# Patient Record
Sex: Female | Born: 2000 | Race: White | Hispanic: No | Marital: Single | Smoking: Never smoker
Health system: Southern US, Community
[De-identification: ages and names within clinical notes are randomized; demographics above are authoritative.]

## PROBLEM LIST (undated history)

## (undated) DIAGNOSIS — J45909 Unspecified asthma, uncomplicated: Secondary | ICD-10-CM

## (undated) DIAGNOSIS — F419 Anxiety disorder, unspecified: Secondary | ICD-10-CM

## (undated) HISTORY — DX: Unspecified asthma, uncomplicated: J45.909

## (undated) HISTORY — PX: DENTAL SURGERY: SHX609

## (undated) HISTORY — DX: Anxiety disorder, unspecified: F41.9

---
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
## 2020-06-29 ENCOUNTER — Emergency Department: Payer: Federal, State, Local not specified - PPO

## 2020-06-29 ENCOUNTER — Emergency Department
Admission: EM | Admit: 2020-06-29 | Discharge: 2020-06-29 | Disposition: A | Discharge status: Home / Self Care | Payer: Federal, State, Local not specified - PPO | Attending: Emergency Medicine | Admitting: Emergency Medicine

## 2020-06-29 ENCOUNTER — Ambulatory Visit
Admission: EM | Admit: 2020-06-29 | Discharge: 2020-06-29 | Disposition: A | Discharge status: Home / Self Care | Payer: Federal, State, Local not specified - PPO | Attending: Family Medicine | Admitting: Family Medicine

## 2020-06-29 ENCOUNTER — Other Ambulatory Visit: Payer: Self-pay

## 2020-06-29 DIAGNOSIS — N946 Dysmenorrhea, unspecified: Secondary | ICD-10-CM

## 2020-06-29 DIAGNOSIS — R1030 Lower abdominal pain, unspecified: Secondary | ICD-10-CM

## 2020-06-29 DIAGNOSIS — R1031 Right lower quadrant pain: Secondary | ICD-10-CM

## 2020-06-29 LAB — URINALYSIS, COMPLETE (UACMP) WITH MICROSCOPIC
Bacteria, UA: NONE SEEN
Bilirubin Urine: NEGATIVE
Glucose, UA: NEGATIVE mg/dL
Ketones, ur: NEGATIVE mg/dL
Nitrite: NEGATIVE
Protein, ur: NEGATIVE mg/dL
Specific Gravity, Urine: 1.023 (ref 1.005–1.030)
pH: 5 (ref 5.0–8.0)

## 2020-06-29 LAB — POCT URINALYSIS DIP (MANUAL ENTRY)
Bilirubin, UA: NEGATIVE
Glucose, UA: NEGATIVE mg/dL
Ketones, POC UA: NEGATIVE mg/dL
Nitrite, UA: NEGATIVE
Protein Ur, POC: NEGATIVE mg/dL
Spec Grav, UA: 1.025 (ref 1.010–1.025)
Urobilinogen, UA: 0.2 E.U./dL
pH, UA: 6 (ref 5.0–8.0)

## 2020-06-29 LAB — COMPREHENSIVE METABOLIC PANEL
ALT: 23 U/L (ref 0–44)
AST: 16 U/L (ref 15–41)
Albumin: 4.1 g/dL (ref 3.5–5.0)
Alkaline Phosphatase: 48 U/L (ref 38–126)
Anion gap: 10 (ref 5–15)
BUN: 13 mg/dL (ref 6–20)
CO2: 25 mmol/L (ref 22–32)
Calcium: 9.4 mg/dL (ref 8.9–10.3)
Chloride: 106 mmol/L (ref 98–111)
Creatinine, Ser: 0.9 mg/dL (ref 0.44–1.00)
GFR, Estimated: >60 mL/min (ref >60)
Glucose, Bld: 90 mg/dL (ref 70–99)
Potassium: 4.5 mmol/L (ref 3.5–5.1)
Sodium: 141 mmol/L (ref 135–145)
Total Bilirubin: 0.7 mg/dL (ref 0.3–1.2)
Total Protein: 7.5 g/dL (ref 6.5–8.1)

## 2020-06-29 LAB — CBC
HCT: 44.9 % (ref 36.0–46.0)
Hemoglobin: 14.9 g/dL (ref 12.0–15.0)
MCH: 28.7 pg (ref 26.0–34.0)
MCHC: 33.2 g/dL (ref 30.0–36.0)
MCV: 86.3 fL (ref 80.0–100.0)
Platelets: 318 10*3/uL (ref 150–400)
RBC: 5.2 MIL/uL — ABNORMAL HIGH (ref 3.87–5.11)
RDW: 11.9 % (ref 11.5–15.5)
WBC: 6.7 10*3/uL (ref 4.0–10.5)
nRBC: 0 % (ref 0.0–0.2)

## 2020-06-29 LAB — LIPASE, BLOOD: Lipase: 23 U/L (ref 11–51)

## 2020-06-29 LAB — POCT URINE PREGNANCY: Preg Test, Ur: NEGATIVE

## 2020-06-29 LAB — POC URINE PREG, ED: Preg Test, Ur: NEGATIVE

## 2020-06-29 MED ORDER — IOHEXOL 300 MG/ML  SOLN
100.0000 mL | Freq: Once | INTRAMUSCULAR | Status: AC | PRN
  Administered 2020-06-29: 100 mL via INTRAVENOUS
  Filled 2020-06-29: qty 100

## 2020-06-29 NOTE — ED Triage Notes (Signed)
Pt comes with c/o RLQ pain for last few days. Pt states some nausea and little diarrhea. Pt states she went to UC and was sent here for scan.

## 2020-06-29 NOTE — ED Triage Notes (Signed)
Pt presents with complaints of lower abdominal pain x 2 days. Pain is worse on the right side. Area is tender to touch and shoots across lower abdomen with palpation. Pt denies any other symptoms. Endorses feeling light headed when she turns her head for "awhile now".  Pt also has a rash on the back of her left arm from where a band aid was present.

## 2020-06-29 NOTE — Discharge Instructions (Addendum)
There is some concern for appendicitis I would recommend CT scan Please go to the ER to rule this out.

## 2020-06-29 NOTE — ED Provider Notes (Signed)
[facility] Emergency Department Provider Note   ____________________________________________    I have reviewed the triage vital signs and the nursing notes.   HISTORY  Chief Complaint RLQ pain     HPI Haley Williams is a 20 y.o. female who presents with complaints of right lower quadrant abdominal pain x3 days.  Patient reports the pain is mild and moderate.  Is made worse by walking or pressing on the area.  Went to urgent care prior to coming to the emergency department, concern for appendicitis so sent for imaging.  Patient has a history of PCOS, she is currently menstruating but her symptoms are worse than typical menstrual cramps.  No fever chills nausea vomiting.  Does not take anything for this  History reviewed. No pertinent past medical history.  There are no problems to display for this patient.   History reviewed. No pertinent surgical history.  Prior to Admission medications   Not on File     Allergies Patient has no known allergies.  Family History  Problem Relation Age of Onset  . Healthy Mother   . Healthy Father     Social History Social History   Tobacco Use  . Smoking status: Never Smoker  . Smokeless tobacco: Never Used    Review of Systems  Constitutional: No fever/chills Eyes: No visual changes.  ENT: No sore throat. Cardiovascular: Denies chest pain. Respiratory: Denies shortness of breath. Gastrointestinal: As above Genitourinary: Negative for dysuria. Musculoskeletal: Negative for back pain. Skin: Negative for rash. Neurological: Negative for headaches or weakness   ____________________________________________   PHYSICAL EXAM:  VITAL SIGNS: ED Triage Vitals [06/29/20 1131]  Enc Vitals Group     BP 121/72     Pulse Rate 78     Resp 18     Temp 98.3 F (36.8 C)     Temp src      SpO2 100 %     Weight 73.9 kg (163 lb)     Height 1.651 m (5\' 5" )     Head Circumference      Peak Flow       Pain Score 4     Pain Loc      Pain Edu?      Excl. in GC?     Constitutional: Alert and oriented. Eyes: Conjunctivae are normal.  Head: Atraumatic.  Cardiovascular: Normal rate, regular rhythm. Good peripheral circulation. Respiratory: Normal respiratory effort.  No retractions.  Gastrointestinal: Soft, mild tenderness right lower quadrant no distention.  No CVA tenderness. Genitourinary: deferred Musculoskeletal: No lower extremity tenderness nor edema.  Warm and well perfused Neurologic:  Normal speech and language. No gross focal neurologic deficits are appreciated.  Skin:  Skin is warm, dry and intact. No rash noted. Psychiatric: Mood and affect are normal. Speech and behavior are normal.  ____________________________________________   LABS (all labs ordered are listed, but only abnormal results are displayed)  Labs Reviewed  CBC - Abnormal; Notable for the following components:      Result Value   RBC 5.20 (*)    All other components within normal limits  URINALYSIS, COMPLETE (UACMP) WITH MICROSCOPIC - Abnormal; Notable for the following components:   Color, Urine YELLOW (*)    APPearance HAZY (*)    Hgb urine dipstick MODERATE (*)    Leukocytes,Ua MODERATE (*)    All other components within normal limits  LIPASE, BLOOD  COMPREHENSIVE METABOLIC PANEL  POC URINE PREG, ED   ____________________________________________  EKG  None ____________________________________________  RADIOLOGY  CT abdomen pelvis reviewed by me ____________________________________________   PROCEDURES  Procedure(s) performed: No  Procedures   Critical Care performed: No ____________________________________________   INITIAL IMPRESSION / ASSESSMENT AND PLAN / ED COURSE  Pertinent labs & imaging results that were available during my care of the patient were reviewed by me and considered in my medical decision making (see chart for details).  Patient here for right lower  quadrant pain.  Differential includes menstrual cramps, ruptured ovarian cyst/ovarian cyst, appendicitis, ureterolithiasis, UTI  Lab work is overall quite reassuring, very minimal tenderness to palpation  White blood cell count is normal, sent for CT  CT abdomen pelvis is negative for appendicitis, appropriate for discharge with supportive care, outpatient follow-up    ____________________________________________   FINAL CLINICAL IMPRESSION(S) / ED DIAGNOSES  Final diagnoses:  Right lower quadrant abdominal pain        Note:  This document was prepared using Dragon voice recognition software and may include unintentional dictation errors.   Jene Every, MD 06/29/20 1324

## 2020-06-29 NOTE — ED Provider Notes (Signed)
Renaldo Fiddler    CSN: [id number] Arrival date & time: 06/29/20  0950      History   Chief Complaint Chief Complaint  Patient presents with  . Abdominal Pain    HPI Haley Williams is a 20 y.o. female.   Patient is a 20 year old female who presents today with right lower quadrant, lower abdominal discomfort.  This has been worsening over the past 4 days.  Pain is worse on the right but radiates to the left.  Some umbilical pain.  Tender when she walks when she sits and tender to touch.  Slightly lightheaded.  No fever, chills, nausea or vomiting.  No dysuria, hematuria or urinary frequency.  Currently on her menstrual cycle.  Does have history of PCOS.     History reviewed. No pertinent past medical history.  There are no problems to display for this patient.   History reviewed. No pertinent surgical history.  OB History   No obstetric history on file.      Home Medications    Prior to Admission medications   Not on File    Family History Family History  Problem Relation Age of Onset  . Healthy Mother   . Healthy Father     Social History Social History   Tobacco Use  . Smoking status: Never Smoker  . Smokeless tobacco: Never Used     Allergies   Patient has no known allergies.   Review of Systems Review of Systems   Physical Exam Triage Vital Signs ED Triage Vitals  Enc Vitals Group     BP 06/29/20 1006 109/72     Pulse Rate 06/29/20 1006 81     Resp 06/29/20 1006 18     Temp 06/29/20 1006 98.1 F (36.7 C)     Temp Source 06/29/20 1006 Oral     SpO2 06/29/20 1006 98 %     Weight --      Height --      Head Circumference --      Peak Flow --      Pain Score 06/29/20 1017 6     Pain Loc --      Pain Edu? --      Excl. in GC? --    No data found.  Updated Vital Signs BP 109/72 (BP Location: Left Arm)   Pulse 81   Temp 98.1 F (36.7 C) (Oral)   Resp 18   LMP 06/29/2020   SpO2 98%   Visual Acuity Right Eye  Distance:   Left Eye Distance:   Bilateral Distance:    Right Eye Near:   Left Eye Near:    Bilateral Near:     Physical Exam Vitals and nursing note reviewed.  Constitutional:      General: She is not in acute distress.    Appearance: Normal appearance. She is not ill-appearing, toxic-appearing or diaphoretic.  HENT:     Head: Normocephalic.     Nose: Nose normal.  Eyes:     Conjunctiva/sclera: Conjunctivae normal.  Pulmonary:     Effort: Pulmonary effort is normal.  Abdominal:     General: Abdomen is flat. Bowel sounds are normal.     Palpations: Abdomen is soft.     Tenderness: There is abdominal tenderness in the right lower quadrant, periumbilical area and left lower quadrant. There is no right CVA tenderness, left CVA tenderness, guarding or rebound.  Musculoskeletal:        General: Normal range of motion.  Cervical back: Normal range of motion.  Skin:    General: Skin is warm and dry.     Findings: No rash.  Neurological:     Mental Status: She is alert.  Psychiatric:        Mood and Affect: Mood normal.      UC Treatments / Results  Labs (all labs ordered are listed, but only abnormal results are displayed) Labs Reviewed  POCT URINALYSIS DIP (MANUAL ENTRY) - Abnormal; Notable for the following components:      Result Value   Blood, UA moderate (*)    Leukocytes, UA Trace (*)    All other components within normal limits  URINE CULTURE  POCT URINE PREGNANCY    EKG   Radiology No results found.  Procedures Procedures (including critical care time)  Medications Ordered in UC Medications - No data to display  Initial Impression / Assessment and Plan / UC Course  I have reviewed the triage vital signs and the nursing notes.  Pertinent labs & imaging results that were available during my care of the patient were reviewed by me and considered in my medical decision making (see chart for details).     Lower abdominal pain. Concerns for  possible appendicitis based on exam and symptoms. Urine with trace leuks and moderate blood here today.  Patient currently on her menstrual cycle.  Will send for culture. She is not having any urinary symptoms. Sending patient to the ER for rule out appendicitis Final Clinical Impressions(s) / UC Diagnoses   Final diagnoses:  Lower abdominal pain     Discharge Instructions     There is some concern for appendicitis I would recommend CT scan Please go to the ER to rule this out.      ED Prescriptions    None     PDMP not reviewed this encounter.   Janace Aris, NP 06/29/20 1057

## 2020-06-29 NOTE — Discharge Instructions (Addendum)
Your lab tests and CT scan today were all reassuring. Take ibuprofen and/or tylenol for pain.

## 2020-06-30 ENCOUNTER — Ambulatory Visit: Payer: Self-pay

## 2020-06-30 LAB — URINE CULTURE: Culture: 40000 — AB

## 2020-08-24 ENCOUNTER — Encounter: Payer: Self-pay | Admitting: Emergency Medicine

## 2020-08-24 ENCOUNTER — Emergency Department: Payer: Federal, State, Local not specified - PPO

## 2020-08-24 ENCOUNTER — Other Ambulatory Visit: Payer: Self-pay

## 2020-08-24 ENCOUNTER — Emergency Department
Admission: EM | Admit: 2020-08-24 | Discharge: 2020-08-24 | Disposition: A | Discharge status: Home / Self Care | Payer: Federal, State, Local not specified - PPO | Attending: Student in an Organized Health Care Education/Training Program | Admitting: Student in an Organized Health Care Education/Training Program

## 2020-08-24 DIAGNOSIS — R1031 Right lower quadrant pain: Secondary | ICD-10-CM | POA: Insufficient documentation

## 2020-08-24 DIAGNOSIS — R1030 Lower abdominal pain, unspecified: Secondary | ICD-10-CM

## 2020-08-24 LAB — COMPREHENSIVE METABOLIC PANEL
ALT: 31 U/L (ref 0–44)
AST: 24 U/L (ref 15–41)
Albumin: 4.6 g/dL (ref 3.5–5.0)
Alkaline Phosphatase: 50 U/L (ref 38–126)
Anion gap: 7 (ref 5–15)
BUN: 21 mg/dL — ABNORMAL HIGH (ref 6–20)
CO2: 27 mmol/L (ref 22–32)
Calcium: 9.7 mg/dL (ref 8.9–10.3)
Chloride: 103 mmol/L (ref 98–111)
Creatinine, Ser: 0.93 mg/dL (ref 0.44–1.00)
GFR, Estimated: >60 mL/min (ref >60)
Glucose, Bld: 103 mg/dL — ABNORMAL HIGH (ref 70–99)
Potassium: 4 mmol/L (ref 3.5–5.1)
Sodium: 137 mmol/L (ref 135–145)
Total Bilirubin: 0.8 mg/dL (ref 0.3–1.2)
Total Protein: 7.5 g/dL (ref 6.5–8.1)

## 2020-08-24 LAB — CBC WITH DIFFERENTIAL/PLATELET
Abs Immature Granulocytes: 0.03 10*3/uL (ref 0.00–0.07)
Basophils Absolute: 0 10*3/uL (ref 0.0–0.1)
Basophils Relative: 0 %
Eosinophils Absolute: 0.2 10*3/uL (ref 0.0–0.5)
Eosinophils Relative: 2 %
HCT: 43.5 % (ref 36.0–46.0)
Hemoglobin: 14.4 g/dL (ref 12.0–15.0)
Immature Granulocytes: 0 %
Lymphocytes Relative: 36 %
Lymphs Abs: 3.4 10*3/uL (ref 0.7–4.0)
MCH: 28.7 pg (ref 26.0–34.0)
MCHC: 33.1 g/dL (ref 30.0–36.0)
MCV: 86.7 fL (ref 80.0–100.0)
Monocytes Absolute: 0.5 10*3/uL (ref 0.1–1.0)
Monocytes Relative: 5 %
Neutro Abs: 5.2 10*3/uL (ref 1.7–7.7)
Neutrophils Relative %: 57 %
Platelets: 340 10*3/uL (ref 150–400)
RBC: 5.02 MIL/uL (ref 3.87–5.11)
RDW: 12 % (ref 11.5–15.5)
WBC: 9.3 10*3/uL (ref 4.0–10.5)
nRBC: 0 % (ref 0.0–0.2)

## 2020-08-24 LAB — URINALYSIS, COMPLETE (UACMP) WITH MICROSCOPIC
Bilirubin Urine: NEGATIVE
Glucose, UA: NEGATIVE mg/dL
Ketones, ur: NEGATIVE mg/dL
Nitrite: NEGATIVE
Protein, ur: NEGATIVE mg/dL
Specific Gravity, Urine: 1.018 (ref 1.005–1.030)
pH: 6 (ref 5.0–8.0)

## 2020-08-24 LAB — LIPASE, BLOOD: Lipase: 31 U/L (ref 11–51)

## 2020-08-24 LAB — POC URINE PREG, ED: Preg Test, Ur: NEGATIVE

## 2020-08-24 MED ORDER — DICYCLOMINE HCL 10 MG PO CAPS: 10.0000 mg | ORAL_CAPSULE | Freq: Three times a day (TID) | ORAL | 0 refills | Status: DC | PRN

## 2020-08-24 MED ORDER — SODIUM CHLORIDE 0.9 % IV BOLUS
500.0000 mL | Freq: Once | INTRAVENOUS | Status: AC
  Administered 2020-08-24: 500 mL via INTRAVENOUS

## 2020-08-24 MED ORDER — OXYCODONE HCL 5 MG PO TABS
5.0000 mg | ORAL_TABLET | Freq: Once | ORAL | Status: AC
  Administered 2020-08-24: 5 mg via ORAL
  Filled 2020-08-24: qty 1

## 2020-08-24 MED ORDER — IOHEXOL 300 MG/ML  SOLN
100.0000 mL | Freq: Once | INTRAMUSCULAR | Status: AC | PRN
  Administered 2020-08-24: 100 mL via INTRAVENOUS

## 2020-08-24 MED ORDER — KETOROLAC TROMETHAMINE 30 MG/ML IJ SOLN
15.0000 mg | Freq: Once | INTRAMUSCULAR | Status: AC
  Administered 2020-08-24: 15 mg via INTRAVENOUS
  Filled 2020-08-24: qty 1

## 2020-08-24 NOTE — ED Provider Notes (Signed)
[facility] Emergency Department Provider Note    Event Date/Time   First MD Initiated Contact with Patient 08/24/20 [phone]     (approximate)  I have reviewed the triage vital signs and the nursing notes.   HISTORY  Chief Complaint Abdominal Pain    HPI Haley Williams is a 20 y.o. female no significant past medical history presents to the ER for evaluation of right lower quadrant pain.  States it is stabbing in nature.  States she had similar pain back in January.  States she has a history of irregular bowel movements.  Has scheduled follow-up with PCP but does not have appointment for several weeks.  Also had recent ultrasound that did not show any significant abnormality.  She has completed her menstrual cycle.  Denies any vaginal bleeding or discharge.  No dysuria.  No flank pain.    History reviewed. No pertinent past medical history. Family History  Problem Relation Age of Onset  . Healthy Mother   . Healthy Father    Past Surgical History:  Procedure Laterality Date  . DENTAL SURGERY     There are no problems to display for this patient.     Prior to Admission medications   Medication Sig Start Date End Date Taking? Authorizing Provider  dicyclomine (BENTYL) 10 MG capsule Take 1 capsule (10 mg total) by mouth 3 (three) times daily as needed for up to 14 days for spasms. 08/24/20 09/07/20 Yes Willy Eddy, MD    Allergies Patient has no known allergies.    Social History Social History   Tobacco Use  . Smoking status: Never Smoker  . Smokeless tobacco: Never Used  Vaping Use  . Vaping Use: Never used    Review of Systems Patient denies headaches, rhinorrhea, blurry vision, numbness, shortness of breath, chest pain, edema, cough, abdominal pain, nausea, vomiting, diarrhea, dysuria, fevers, rashes or hallucinations unless otherwise stated above in HPI. ____________________________________________   PHYSICAL EXAM:  VITAL  SIGNS: Vitals:   08/24/20 0310  BP: (!) 116/93  Pulse: 87  Resp: 18  Temp: 98 F (36.7 C)  SpO2: 100%    Constitutional: Alert and oriented.  Eyes: Conjunctivae are normal.  Head: Atraumatic. Nose: No congestion/rhinnorhea. Mouth/Throat: Mucous membranes are moist.   Neck: No stridor. Painless ROM.  Cardiovascular: Normal rate, regular rhythm. Grossly normal heart sounds.  Good peripheral circulation. Respiratory: Normal respiratory effort.  No retractions. Lungs CTAB. Gastrointestinal: Soft and nontender. No distention. No abdominal bruits. No CVA tenderness. Genitourinary:  Musculoskeletal: No lower extremity tenderness nor edema.  No joint effusions. Neurologic:  Normal speech and language. No gross focal neurologic deficits are appreciated. No facial droop Skin:  Skin is warm, dry and intact. No rash noted. Psychiatric: Mood and affect are normal. Speech and behavior are normal.  ____________________________________________   LABS (all labs ordered are listed, but only abnormal results are displayed)  Results for orders placed or performed during the hospital encounter of 08/24/20 (from the past 24 hour(s))  CBC with Differential     Status: None   Collection Time: 08/24/20  3:12 AM  Result Value Ref Range   WBC 9.3 4.0 - 10.5 K/uL   RBC 5.02 3.87 - 5.11 MIL/uL   Hemoglobin 14.4 12.0 - 15.0 g/dL   HCT 01.7 49.4 - 49.6 %   MCV 86.7 80.0 - 100.0 fL   MCH 28.7 26.0 - 34.0 pg   MCHC 33.1 30.0 - 36.0 g/dL   RDW 75.9 16.3 -  15.5 %   Platelets 340 150 - 400 K/uL   nRBC 0.0 0.0 - 0.2 %   Neutrophils Relative % 57 %   Neutro Abs 5.2 1.7 - 7.7 K/uL   Lymphocytes Relative 36 %   Lymphs Abs 3.4 0.7 - 4.0 K/uL   Monocytes Relative 5 %   Monocytes Absolute 0.5 0.1 - 1.0 K/uL   Eosinophils Relative 2 %   Eosinophils Absolute 0.2 0.0 - 0.5 K/uL   Basophils Relative 0 %   Basophils Absolute 0.0 0.0 - 0.1 K/uL   Immature Granulocytes 0 %   Abs Immature Granulocytes 0.03 0.00  - 0.07 K/uL  Comprehensive metabolic panel     Status: Abnormal   Collection Time: 08/24/20  3:12 AM  Result Value Ref Range   Sodium 137 135 - 145 mmol/L   Potassium 4.0 3.5 - 5.1 mmol/L   Chloride 103 98 - 111 mmol/L   CO2 27 22 - 32 mmol/L   Glucose, Bld 103 (H) 70 - 99 mg/dL   BUN 21 (H) 6 - 20 mg/dL   Creatinine, Ser 0.81 0.44 - 1.00 mg/dL   Calcium 9.7 8.9 - 44.8 mg/dL   Total Protein 7.5 6.5 - 8.1 g/dL   Albumin 4.6 3.5 - 5.0 g/dL   AST 24 15 - 41 U/L   ALT 31 0 - 44 U/L   Alkaline Phosphatase 50 38 - 126 U/L   Total Bilirubin 0.8 0.3 - 1.2 mg/dL   GFR, Estimated >18 >56 mL/min   Anion gap 7 5 - 15  Lipase, blood     Status: None   Collection Time: 08/24/20  3:12 AM  Result Value Ref Range   Lipase 31 11 - 51 U/L  Urinalysis, Complete w Microscopic     Status: Abnormal   Collection Time: 08/24/20  3:12 AM  Result Value Ref Range   Color, Urine YELLOW (A) YELLOW   APPearance CLOUDY (A) CLEAR   Specific Gravity, Urine 1.018 1.005 - 1.030   pH 6.0 5.0 - 8.0   Glucose, UA NEGATIVE NEGATIVE mg/dL   Hgb urine dipstick MODERATE (A) NEGATIVE   Bilirubin Urine NEGATIVE NEGATIVE   Ketones, ur NEGATIVE NEGATIVE mg/dL   Protein, ur NEGATIVE NEGATIVE mg/dL   Nitrite NEGATIVE NEGATIVE   Leukocytes,Ua MODERATE (A) NEGATIVE   RBC / HPF 0-5 0 - 5 RBC/hpf   WBC, UA 11-20 0 - 5 WBC/hpf   Bacteria, UA RARE (A) NONE SEEN   Squamous Epithelial / LPF 21-50 0 - 5   Mucus PRESENT   POC Urine Pregnancy, ED     Status: None   Collection Time: 08/24/20  3:28 AM  Result Value Ref Range   Preg Test, Ur NEGATIVE NEGATIVE   ____________________________________________ ____________________________________________  RADIOLOGY  I personally reviewed all radiographic images ordered to evaluate for the above acute complaints and reviewed radiology reports and findings.  These findings were personally discussed with the patient.  Please see medical record for radiology  report.  ____________________________________________   PROCEDURES  Procedure(s) performed:  Procedures    Critical Care performed: no ____________________________________________   INITIAL IMPRESSION / ASSESSMENT AND PLAN / ED COURSE  Pertinent labs & imaging results that were available during my care of the patient were reviewed by me and considered in my medical decision making (see chart for details).   DDX: Appendicitis, IBD, IBS, ovarian cyst, torsion, musculoskeletal strain  Haley Williams is a 20 y.o. who presents to the ED with presentation as  described above.  Patient well-appearing in no acute distress she is afebrile white count normal patient does have some right lower quadrant abdominal pain.  She is denying any discharge.  She is not febrile.  I have a lower suspicion for PID.  Recent ultrasound was without any cyst therefore much lower suspicion for torsion.  Given new onset pain she described will order CT imaging after discussing risks and benefits including option for additional observation versus ultrasound.  Patient electing to proceed with CT.  Clinical Course as of 08/24/20 0533  Wed Aug 24, 2020  0532 Patient reassessed.  Remains well-appearing nontoxic.  CT imaging is reassuring.  No acute abnormality.  She denies any symptoms of UTI.  May her symptoms are suspicious for IBS but does have family history of IBD therefore I do believe that referral to GI is appropriate.  We discussed conservative management.  Patient agreeable plan.  Discussed signs and symptoms which she should return to the ER. [PR]    Clinical Course User Index [PR] Willy Eddy, MD    The patient was evaluated in Emergency Department today for the symptoms described in the history of present illness. He/she was evaluated in the context of the global COVID-19 pandemic, which necessitated consideration that the patient might be at risk for infection with the SARS-CoV-2 virus that  causes COVID-19. Institutional protocols and algorithms that pertain to the evaluation of patients at risk for COVID-19 are in a state of rapid change based on information released by regulatory bodies including the CDC and federal and state organizations. These policies and algorithms were followed during the patient's care in the ED.  As part of my medical decision making, I reviewed the following data within the electronic MEDICAL RECORD NUMBER Nursing notes reviewed and incorporated, Labs reviewed, notes from prior ED visits and [place] Controlled Substance Database   ____________________________________________   FINAL CLINICAL IMPRESSION(S) / ED DIAGNOSES  Final diagnoses:  Lower abdominal pain      NEW MEDICATIONS STARTED DURING THIS VISIT:  New Prescriptions   DICYCLOMINE (BENTYL) 10 MG CAPSULE    Take 1 capsule (10 mg total) by mouth 3 (three) times daily as needed for up to 14 days for spasms.     Note:  This document was prepared using Dragon voice recognition software and may include unintentional dictation errors.    Willy Eddy, MD 08/24/20 [phone]

## 2020-08-24 NOTE — Discharge Instructions (Addendum)

## 2020-08-24 NOTE — ED Triage Notes (Signed)
Patient ambulatory to triage with steady gait, without difficulty or distress noted; pt reports lower abd pain since December; st has been seen for such prev with no abnormal findings

## 2020-09-19 DIAGNOSIS — R768 Other specified abnormal immunological findings in serum: Secondary | ICD-10-CM | POA: Insufficient documentation

## 2020-10-12 ENCOUNTER — Ambulatory Visit (INDEPENDENT_AMBULATORY_CARE_PROVIDER_SITE_OTHER): Payer: Federal, State, Local not specified - PPO | Admitting: Adult Health

## 2020-10-12 ENCOUNTER — Encounter: Payer: Self-pay | Admitting: Adult Health

## 2020-10-12 ENCOUNTER — Other Ambulatory Visit: Payer: Self-pay

## 2020-10-12 VITALS — BP 104/58 | HR 81 | Temp 98.0°F | Ht 65.0 in | Wt 162.4 lb

## 2020-10-12 DIAGNOSIS — N281 Cyst of kidney, acquired: Secondary | ICD-10-CM

## 2020-10-12 DIAGNOSIS — R140 Abdominal distension (gaseous): Secondary | ICD-10-CM | POA: Diagnosis not present

## 2020-10-12 DIAGNOSIS — M25552 Pain in left hip: Secondary | ICD-10-CM | POA: Insufficient documentation

## 2020-10-12 DIAGNOSIS — G8929 Other chronic pain: Secondary | ICD-10-CM | POA: Insufficient documentation

## 2020-10-12 DIAGNOSIS — M2550 Pain in unspecified joint: Secondary | ICD-10-CM

## 2020-10-12 DIAGNOSIS — E559 Vitamin D deficiency, unspecified: Secondary | ICD-10-CM

## 2020-10-12 DIAGNOSIS — M545 Low back pain, unspecified: Secondary | ICD-10-CM

## 2020-10-12 MED ORDER — MELOXICAM 7.5 MG PO TABS: 7.5000 mg | ORAL_TABLET | Freq: Every day | ORAL | 1 refills | Status: DC

## 2020-10-12 NOTE — Patient Instructions (Signed)
Health Maintenance, Female Adopting a healthy lifestyle and getting preventive care are important in promoting health and wellness. Ask your health care provider about:  The right schedule for you to have regular tests and exams.  Things you can do on your own to prevent diseases and keep yourself healthy. What should I know about diet, weight, and exercise? Eat a healthy diet  Eat a diet that includes plenty of vegetables, fruits, low-fat dairy products, and lean protein.  Do not eat a lot of foods that are high in solid fats, added sugars, or sodium.   Maintain a healthy weight Body mass index (BMI) is used to identify weight problems. It estimates body fat based on height and weight. Your health care provider can help determine your BMI and help you achieve or maintain a healthy weight. Get regular exercise Get regular exercise. This is one of the most important things you can do for your health. Most adults should:  Exercise for at least 150 minutes each week. The exercise should increase your heart rate and make you sweat (moderate-intensity exercise).  Do strengthening exercises at least twice a week. This is in addition to the moderate-intensity exercise.  Spend less time sitting. Even light physical activity can be beneficial. Watch cholesterol and blood lipids Have your blood tested for lipids and cholesterol at 20 years of age, then have this test every 5 years. Have your cholesterol levels checked more often if:  Your lipid or cholesterol levels are high.  You are older than 20 years of age.  You are at high risk for heart disease. What should I know about cancer screening? Depending on your health history and family history, you may need to have cancer screening at various ages. This may include screening for:  Breast cancer.  Cervical cancer.  Colorectal cancer.  Skin cancer.  Lung cancer. What should I know about heart disease, diabetes, and high blood  pressure? Blood pressure and heart disease  High blood pressure causes heart disease and increases the risk of stroke. This is more likely to develop in people who have high blood pressure readings, are of African descent, or are overweight.  Have your blood pressure checked: ? Every 3-5 years if you are 18-39 years of age. ? Every year if you are 40 years old or older. Diabetes Have regular diabetes screenings. This checks your fasting blood sugar level. Have the screening done:  Once every three years after age 40 if you are at a normal weight and have a low risk for diabetes.  More often and at a younger age if you are overweight or have a high risk for diabetes. What should I know about preventing infection? Hepatitis B If you have a higher risk for hepatitis B, you should be screened for this virus. Talk with your health care provider to find out if you are at risk for hepatitis B infection. Hepatitis C Testing is recommended for:  Everyone born from 1945 through 1965.  Anyone with known risk factors for hepatitis C. Sexually transmitted infections (STIs)  Get screened for STIs, including gonorrhea and chlamydia, if: ? You are sexually active and are younger than 20 years of age. ? You are older than 20 years of age and your health care provider tells you that you are at risk for this type of infection. ? Your sexual activity has changed since you were last screened, and you are at increased risk for chlamydia or gonorrhea. Ask your health care provider   if you are at risk.  Ask your health care provider about whether you are at high risk for HIV. Your health care provider may recommend a prescription medicine to help prevent HIV infection. If you choose to take medicine to prevent HIV, you should first get tested for HIV. You should then be tested every 3 months for as long as you are taking the medicine. Pregnancy  If you are about to stop having your period (premenopausal) and  you may become pregnant, seek counseling before you get pregnant.  Take 400 to 800 micrograms (mcg) of folic acid every day if you become pregnant.  Ask for birth control (contraception) if you want to prevent pregnancy. Osteoporosis and menopause Osteoporosis is a disease in which the bones lose minerals and strength with aging. This can result in bone fractures. If you are 65 years old or older, or if you are at risk for osteoporosis and fractures, ask your health care provider if you should:  Be screened for bone loss.  Take a calcium or vitamin D supplement to lower your risk of fractures.  Be given hormone replacement therapy (HRT) to treat symptoms of menopause. Follow these instructions at home: Lifestyle  Do not use any products that contain nicotine or tobacco, such as cigarettes, e-cigarettes, and chewing tobacco. If you need help quitting, ask your health care provider.  Do not use street drugs.  Do not share needles.  Ask your health care provider for help if you need support or information about quitting drugs. Alcohol use  Do not drink alcohol if: ? Your health care provider tells you not to drink. ? You are pregnant, may be pregnant, or are planning to become pregnant.  If you drink alcohol: ? Limit how much you use to 0-1 drink a day. ? Limit intake if you are breastfeeding.  Be aware of how much alcohol is in your drink. In the [place] one drink equals one 12 oz bottle of beer (355 mL), one 5 oz glass of wine (148 mL), or one 1 oz glass of hard liquor (44 mL). General instructions  Schedule regular health, dental, and eye exams.  Stay current with your vaccines.  Tell your health care provider if: ? You often feel depressed. ? You have ever been abused or do not feel safe at home. Summary  Adopting a healthy lifestyle and getting preventive care are important in promoting health and wellness.  Follow your health care provider's instructions about healthy  diet, exercising, and getting tested or screened for diseases.  Follow your health care provider's instructions on monitoring your cholesterol and blood pressure. This information is not intended to replace advice given to you by your health care provider. Make sure you discuss any questions you have with your health care provider. Document Revised: 05/28/2018 Document Reviewed: 05/28/2018 Elsevier Patient Education  2021 Elsevier Inc. Psyllium granules or powder for solution What is this medicine? PSYLLIUM (SIL i yum) is a bulk-forming fiber laxative. This medicine is used to treat constipation. Increasing fiber in the diet may also help lower cholesterol and promote heart health for some people. This medicine may be used for other purposes; ask your health care provider or pharmacist if you have questions. COMMON BRAND NAME(S): Fiber Therapy, GenFiber, Geri-Mucil, Hydrocil, Konsyl, Metamucil, Metamucil MultiHealth, Mucilin, Natural Fiber Therapy, Reguloid What should I tell my health care provider before I take this medicine? They need to know if you have any of these conditions:  blockage in your bowel    difficulty swallowing  inflammatory bowel disease  phenylketonuria  stomach or intestine problems  sudden change in bowel habits lasting more than 2 weeks  an unusual or allergic reaction to psyllium, other medicines, dyes, or preservatives  pregnant or trying or get pregnant  breast-feeding How should I use this medicine? Mix this medicine into a full glass (240 mL) of water or other cool drink. Take this medicine by mouth. Follow the directions on the package labeling, or take as directed by your health care professional. Take your medicine at regular intervals. Do not take your medicine more often than directed. Talk to your pediatrician regarding the use of this medicine in children. While this drug may be prescribed for children as young as 6 years old for selected  conditions, precautions do apply. Overdosage: If you think you have taken too much of this medicine contact a poison control center or emergency room at once. NOTE: This medicine is only for you. Do not share this medicine with others. What if I miss a dose? If you miss a dose, take it as soon as you can. If it is almost time for your next dose, take only that dose. Do not take double or extra doses. What may interact with this medicine? Interactions are not expected. Take this product at least 2 hours before or after other medicines. This list may not describe all possible interactions. Give your health care provider a list of all the medicines, herbs, non-prescription drugs, or dietary supplements you use. Also tell them if you smoke, drink alcohol, or use illegal drugs. Some items may interact with your medicine. What should I watch for while using this medicine? Check with your doctor or health care professional if your symptoms do not start to get better or if they get worse. Stop using this medicine and contact your doctor or health care professional if you have rectal bleeding or if you have to treat your constipation for more than 1 week. These could be signs of a more serious condition. Drink several glasses of water a day while you are taking this medicine. This will help to relieve constipation and prevent dehydration. What side effects may I notice from receiving this medicine? Side effects that you should report to your doctor or health care professional as soon as possible:  allergic reactions like skin rash, itching or hives, swelling of the face, lips, or tongue  breathing problems  chest pain  nausea, vomiting  rectal bleeding  trouble swallowing Side effects that usually do not require medical attention (report to your doctor or health care professional if they continue or are bothersome):  bloating  gas  stomach cramps This list may not describe all possible side  effects. Call your doctor for medical advice about side effects. You may report side effects to FDA at 1-800-FDA-1088. Where should I keep my medicine? Keep out of the reach of children. Store at room temperature between 15 and 30 degrees C (59 and 86 degrees F). Protect from moisture. Throw away any unused medicine after the expiration date. NOTE: This sheet is a summary. It may not cover all possible information. If you have questions about this medicine, talk to your doctor, pharmacist, or health care provider.  2021 Elsevier/Gold Standard (2017-10-29 15:41:08)  

## 2020-10-12 NOTE — Progress Notes (Addendum)
New Patient Office Visit  Subjective:  Patient ID: Haley Williams, female    DOB: 08/30/2000  Age: 20 y.o. MRN: [id number]  CC:  Chief Complaint  Patient presents with  . New Patient (Initial Visit)    Previous PCP was [facility] in [place].   . Abdominal Pain    Pt c/o abdominal pain x2months. Going to a Theatre [facility].  . Cyst    Cyst in kidneys     HPI Haley Williams presents for new patient follow up on chronic conditions.   History of degenerative disc disease and scoliosis.   Feels she has increased abdominal bloating, reports she has normal bowel  movements. Transvaginal ultrasound and CT abdomen done. May 3rd she is seeing gastroenterology.    LMP 10/06/20. soft Seeing Dr. Allena Katz for positive ANA at rheumatology. Who felt false positive at this time.  She has had back problems since age 64, she had epidural injections in orthopedics.  She reports she had 6 epidural and she did not feel relief from these.    She was told she has a cyst in her kidney at last MRI.   MRI of spine not in care everywhere for January 26 2020.   Patient  denies any fever, body aches,chills, rash, chest pain, shortness of breath, nausea, vomiting, or diarrhea.    Past Medical History:  Diagnosis Date  . Anxiety   . Asthma     Past Surgical History:  Procedure Laterality Date  . DENTAL SURGERY    . TONSILLECTOMY AND ADENOIDECTOMY  2005    Family History  Problem Relation Age of Onset  . Healthy Mother   . Healthy Father   . Hearing loss Maternal Grandmother   . Cancer Maternal Grandfather   . Hearing loss Maternal Grandfather   . Hypertension Maternal Grandfather   . Cancer Paternal Grandmother   . Hearing loss Paternal Grandfather     Social History   Socioeconomic History  . Marital status: Single    Spouse name: Not on file  . Number of children: Not on file  . Years of education: Not on file  . Highest education level: Not on file  Occupational History   . Not on file  Tobacco Use  . Smoking status: Never Smoker  . Smokeless tobacco: Never Used  Vaping Use  . Vaping Use: Never used  Substance and Sexual Activity  . Alcohol use: Never  . Drug use: Never  . Sexual activity: Not Currently  Other Topics Concern  . Not on file  Social History Narrative  . Not on file   Social Determinants of Health   Financial Resource Strain: Not on file  Food Insecurity: Not on file  Transportation Needs: Not on file  Physical Activity: Not on file  Stress: Not on file  Social Connections: Not on file  Intimate Partner Violence: Not on file    ROS Review of Systems  Constitutional: Positive for fatigue.  HENT: Negative.   Respiratory: Negative.   Gastrointestinal: Positive for abdominal distention. Negative for abdominal pain and anal bleeding.  Genitourinary: Negative.   Musculoskeletal: Positive for arthralgias and back pain.  Skin: Negative.   Neurological: Negative.   Hematological: Negative.   Psychiatric/Behavioral: Negative.     Objective:   Today's Vitals: BP (!) 104/58 (BP Location: Left Arm, Patient Position: Sitting)   Pulse 81   Temp 98 F (36.7 C)   Ht 5\' 5"  (1.651 m)   Wt 162 lb 6.4 oz (73.7 kg)  LMP 01/29/2020   SpO2 96%   BMI 27.02 kg/m   Physical Exam  Assessment & Plan:   Problem List Items Addressed This Visit      Genitourinary   Renal cyst   Relevant Orders   US Renal     Other   Abdominal bloating - Primary   Relevant Orders   CBC with Differential/Platelet   Comprehensive metabolic panel   Lipid panel   TSH   Lipase   Amylase   Arthralgia   Relevant Medications   meloxicam (MOBIC) 7.5 MG tablet   Other Relevant Orders   Rheumatoid factor   Vitamin D deficiency   Relevant Orders   VITAMIN D 25 Hydroxy (Vit-D Deficiency, Fractures)   Chronic midline low back pain without sciatica   Relevant Medications   meloxicam (MOBIC) 7.5 MG tablet   Other Relevant Orders   Urinalysis,  microscopic only     She has had chronic joint pain and lower back pain, said she has had a MRI in August 2021 but provider unable to see this. Was told she had a cyst on her kidney. Will do renal ultrasound. Recommend a orthopedics evaluation however she declined at this time currently.   Recommend psyllium husks mixed with 8- 10 ounces of liquid at night as well for questionable constipation as cause. She also will see gastroenterology the first part of May she will need to keep this appointment.   Meds ordered this encounter  Medications  . meloxicam (MOBIC) 7.5 MG tablet    Sig: Take 1 tablet (7.5 mg total) by mouth daily.    Dispense:  30 tablet    Refill:  1    Outpatient Encounter Medications as of 10/12/2020  Medication Sig  . benzoyl peroxide 5 % external liquid   . clindamycin (CLEOCIN T) 1 % lotion APPLY SPARINGLY TO FACE AND UPPER BACK/SHOULDERS 1-2 TIMES DAILY  . JUNEL FE 1.5/30 1.5-30 MG-MCG tablet Take 1 tablet by mouth daily.  . meloxicam (MOBIC) 7.5 MG tablet Take 1 tablet (7.5 mg total) by mouth daily.  Marland Kitchen tretinoin (RETIN-A) 0.025 % cream APPLY TO FACE AT BEDTIME  . dicyclomine (BENTYL) 10 MG capsule Take 1 capsule (10 mg total) by mouth 3 (three) times daily as needed for up to 14 days for spasms.   No facility-administered encounter medications on file as of 10/12/2020.    Follow-up: Return in about 3 months (around 01/11/2021), or if symptoms worsen or fail to improve, for at any time for any worsening symptoms, Go to Emergency room/ urgent care if worse.   Jairo Ben, FNP

## 2020-10-19 ENCOUNTER — Other Ambulatory Visit: Payer: Self-pay

## 2020-10-19 ENCOUNTER — Ambulatory Visit (HOSPITAL_COMMUNITY)
Admission: RE | Admit: 2020-10-19 | Discharge: 2020-10-19 | Disposition: A | Discharge status: Home / Self Care | Payer: Federal, State, Local not specified - PPO | Source: Ambulatory Visit | Attending: Adult Health | Admitting: Adult Health

## 2020-10-19 DIAGNOSIS — N281 Cyst of kidney, acquired: Secondary | ICD-10-CM | POA: Diagnosis present

## 2020-10-24 NOTE — Progress Notes (Signed)
Renal ultrasound is within normal limits.

## 2021-01-11 ENCOUNTER — Ambulatory Visit: Payer: Federal, State, Local not specified - PPO | Admitting: Adult Health

## 2021-02-16 ENCOUNTER — Ambulatory Visit: Payer: Federal, State, Local not specified - PPO | Admitting: Adult Health

## 2021-03-24 ENCOUNTER — Encounter: Payer: Self-pay | Admitting: Emergency Medicine

## 2021-03-24 ENCOUNTER — Ambulatory Visit
Admission: EM | Admit: 2021-03-24 | Discharge: 2021-03-24 | Disposition: A | Discharge status: Home / Self Care | Payer: Federal, State, Local not specified - PPO | Attending: Emergency Medicine | Admitting: Emergency Medicine

## 2021-03-24 DIAGNOSIS — J029 Acute pharyngitis, unspecified: Secondary | ICD-10-CM | POA: Diagnosis not present

## 2021-03-24 LAB — POCT RAPID STREP A (OFFICE): Rapid Strep A Screen: NEGATIVE

## 2021-03-24 NOTE — ED Provider Notes (Signed)
[facility]    CSN: [id number] Arrival date & time: 03/24/21  1032      History   Chief Complaint Chief Complaint  Patient presents with   Sore Throat    HPI Haley Williams is a 20 y.o. female.  Patient presents with sore throat x6 days.  She thinks she may have had a low-grade fever at the onset of her symptoms but none since.  Treatment at home with OTC cold medication.  She denies chills, rash, earache, cough, shortness of breath, or other symptoms.  Negative at-home COVID test.  Her medical history includes anxiety, asthma, chronic low back pain.  The history is provided by the patient and medical records.   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      Home Medications    Prior to Admission medications   Medication Sig Start Date End Date Taking? Authorizing Provider  JUNEL FE 1.5/30 1.5-30 MG-MCG tablet Take 1 tablet by mouth daily. 09/26/20  Yes [provider]  benzoyl peroxide 5 % external liquid  09/30/17   [provider]  clindamycin (CLEOCIN T) 1 % lotion APPLY SPARINGLY TO FACE AND UPPER BACK/SHOULDERS 1-2 TIMES DAILY 08/15/17   [provider]  dicyclomine (BENTYL) 10 MG capsule Take 1 capsule (10 mg total) by mouth 3 (three) times daily as needed for up to 14 days for spasms. 08/24/20 09/07/20   Willy Eddy, MD  meloxicam (MOBIC) 7.5 MG tablet Take 1 tablet (7.5 mg total) by mouth daily. 10/12/20   Flinchum, Eula Fried, FNP  tretinoin (RETIN-A) 0.025 % cream APPLY TO FACE AT BEDTIME 06/27/17   [provider]    Family History Family History  Problem Relation Age of Onset   Healthy Mother    Healthy Father    Hearing loss Maternal Grandmother    Cancer Maternal Grandfather    Hearing loss Maternal Grandfather    Hypertension Maternal Grandfather    Cancer Paternal Grandmother    Hearing loss Paternal Grandfather     Social History Social History   Tobacco Use   Smoking status: Never   Smokeless tobacco: Never  Vaping Use   Vaping Use: Never used  Substance Use Topics   Alcohol use: Never   Drug use: Never     Allergies   Patient has no known allergies.   Review of Systems Review of Systems  Constitutional:  Negative for chills and fever.  HENT:  Positive for sore throat. Negative for ear pain.   Respiratory:  Negative for cough and shortness of breath.   Cardiovascular:  Negative for chest pain and palpitations.  Gastrointestinal:  Negative for abdominal pain and vomiting.  Skin:  Negative for color change and rash.  All other systems reviewed and are negative.   Physical Exam  Triage Vital Signs ED Triage Vitals [03/24/21 1046]  Enc Vitals Group     BP 114/75     Pulse Rate 96     Resp 18     Temp 98.1 F (36.7 C)     Temp src      SpO2 97 %     Weight      Height      Head Circumference      Peak Flow      Pain Score 10     Pain Loc      Pain Edu?      Excl. in GC?    No data found.  Updated Vital Signs BP 114/75 (BP Location: Left Arm)   Pulse 96   Temp 98.1 F (36.7 C)   Resp 18   LMP 03/19/2021 (Exact Date)   SpO2 97%   Visual Acuity Right Eye Distance:   Left Eye Distance:   Bilateral Distance:    Right Eye Near:   Left Eye Near:    Bilateral Near:     Physical Exam Vitals and nursing note reviewed.   Constitutional:      General: She is not in acute distress.    Appearance: She is well-developed. She is not ill-appearing.  HENT:     Head: Normocephalic and atraumatic.     Right Ear: Tympanic membrane normal.     Left Ear: Tympanic membrane normal.     Nose: Nose normal.     Mouth/Throat:     Mouth: Mucous membranes are moist.     Pharynx: Oropharynx is clear.  Eyes:     Conjunctiva/sclera: Conjunctivae normal.  Cardiovascular:     Rate and Rhythm: Normal rate and regular rhythm.     Heart sounds: Normal heart sounds.  Pulmonary:     Effort: Pulmonary effort is normal. No respiratory distress.     Breath sounds: Normal breath sounds.  Abdominal:     Palpations: Abdomen is soft.     Tenderness: There is no abdominal tenderness.  Musculoskeletal:     Cervical back: Neck supple.  Skin:    General: Skin is warm and dry.  Neurological:     General: No focal deficit present.     Mental Status: She is alert and oriented to person, place, and time.  Psychiatric:        Mood and Affect: Mood normal.        Behavior: Behavior normal.     UC Treatments / Results  Labs (all labs ordered are listed, but only abnormal results are displayed) Labs Reviewed  POCT RAPID STREP A (OFFICE)    EKG   Radiology No results found.  Procedures Procedures (including critical care time)  Medications Ordered in UC Medications - No data to display  Initial Impression / Assessment and Plan / UC Course  I have reviewed the triage vital signs and the nursing notes.  Pertinent labs & imaging results that were available during my care of the patient were reviewed by me and considered in my medical decision making (see chart for details).  Sore throat.  Rapid strep negative.  Patient declines COVID test.  Discussed symptomatic treatment, including Tylenol or ibuprofen.  Instructed her to follow-up with her PCP if her symptoms are not improving.  Education provided on sore throats.  Patient  agrees to plan of care   Final Clinical Impressions(s) / UC Diagnoses   Final diagnoses:  Sore throat     Discharge Instructions  Your rapid strep test is negative.    Take Tylenol or ibuprofen as needed for discomfort.    Follow up with your primary care provider if your symptoms are not improving.          ED Prescriptions   None    PDMP not reviewed this encounter.   Mickie Bail, NP 03/24/21 1134

## 2021-03-24 NOTE — ED Triage Notes (Signed)
Pt c/o ST x 6 days with fever and hurts to swallow.

## 2021-03-24 NOTE — Discharge Instructions (Addendum)
Your rapid strep test is negative.    Take Tylenol or ibuprofen as needed for discomfort.    Follow-up with your primary care provider if your symptoms are not improving.       

## 2021-04-04 ENCOUNTER — Ambulatory Visit: Payer: Federal, State, Local not specified - PPO | Admitting: Adult Health

## 2021-07-03 ENCOUNTER — Ambulatory Visit: Payer: Federal, State, Local not specified - PPO | Admitting: Adult Health

## 2021-07-06 ENCOUNTER — Other Ambulatory Visit: Payer: Self-pay | Admitting: Adult Health

## 2021-07-06 ENCOUNTER — Ambulatory Visit: Payer: Federal, State, Local not specified - PPO | Admitting: Adult Health

## 2021-07-06 ENCOUNTER — Encounter: Payer: Self-pay | Admitting: Adult Health

## 2021-07-06 ENCOUNTER — Ambulatory Visit (INDEPENDENT_AMBULATORY_CARE_PROVIDER_SITE_OTHER): Payer: Federal, State, Local not specified - PPO

## 2021-07-06 ENCOUNTER — Other Ambulatory Visit: Payer: Self-pay

## 2021-07-06 VITALS — BP 112/76 | HR 84 | Ht 65.0 in | Wt 178.6 lb

## 2021-07-06 DIAGNOSIS — R202 Paresthesia of skin: Secondary | ICD-10-CM

## 2021-07-06 DIAGNOSIS — G8929 Other chronic pain: Secondary | ICD-10-CM | POA: Diagnosis not present

## 2021-07-06 DIAGNOSIS — R140 Abdominal distension (gaseous): Secondary | ICD-10-CM

## 2021-07-06 DIAGNOSIS — M5442 Lumbago with sciatica, left side: Secondary | ICD-10-CM

## 2021-07-06 DIAGNOSIS — M5136 Other intervertebral disc degeneration, lumbar region: Secondary | ICD-10-CM | POA: Diagnosis not present

## 2021-07-06 DIAGNOSIS — M545 Low back pain, unspecified: Secondary | ICD-10-CM | POA: Diagnosis not present

## 2021-07-06 DIAGNOSIS — E559 Vitamin D deficiency, unspecified: Secondary | ICD-10-CM

## 2021-07-06 DIAGNOSIS — M4124 Other idiopathic scoliosis, thoracic region: Secondary | ICD-10-CM

## 2021-07-06 DIAGNOSIS — M2550 Pain in unspecified joint: Secondary | ICD-10-CM

## 2021-07-06 DIAGNOSIS — R768 Other specified abnormal immunological findings in serum: Secondary | ICD-10-CM

## 2021-07-06 LAB — CBC WITH DIFFERENTIAL/PLATELET
Basophils Absolute: 0 10*3/uL (ref 0.0–0.1)
Basophils Relative: 0.5 % (ref 0.0–3.0)
Eosinophils Absolute: 0.1 10*3/uL (ref 0.0–0.7)
Eosinophils Relative: 2.3 % (ref 0.0–5.0)
HCT: 41 % (ref 36.0–46.0)
Hemoglobin: 13.4 g/dL (ref 12.0–15.0)
Lymphocytes Relative: 23.4 % (ref 12.0–46.0)
Lymphs Abs: 1.5 10*3/uL (ref 0.7–4.0)
MCHC: 32.5 g/dL (ref 30.0–36.0)
MCV: 87.9 fl (ref 78.0–100.0)
Monocytes Absolute: 0.7 10*3/uL (ref 0.1–1.0)
Monocytes Relative: 10.1 % (ref 3.0–12.0)
Neutro Abs: 4.2 10*3/uL (ref 1.4–7.7)
Neutrophils Relative %: 63.7 % (ref 43.0–77.0)
Platelets: 278 10*3/uL (ref 150.0–400.0)
RBC: 4.67 Mil/uL (ref 3.87–5.11)
RDW: 12.3 % (ref 11.5–14.6)
WBC: 6.6 10*3/uL (ref 4.5–10.5)

## 2021-07-06 LAB — HEMOGLOBIN A1C: Hgb A1c MFr Bld: 5.2 % (ref 4.6–6.5)

## 2021-07-06 LAB — B12 AND FOLATE PANEL
Folate: 16.9 ng/mL (ref >5.9)
Vitamin B-12: 219 pg/mL (ref 211–911)

## 2021-07-06 LAB — COMPREHENSIVE METABOLIC PANEL
ALT: 30 U/L (ref 0–35)
AST: 30 U/L (ref 0–37)
Albumin: 4.3 g/dL (ref 3.5–5.2)
Alkaline Phosphatase: 54 U/L (ref 39–117)
BUN: 9 mg/dL (ref 6–23)
CO2: 29 mEq/L (ref 19–32)
Calcium: 9.3 mg/dL (ref 8.4–10.5)
Chloride: 103 mEq/L (ref 96–112)
Creatinine, Ser: 0.99 mg/dL (ref 0.40–1.20)
GFR: 82.19 mL/min (ref >60.00)
Glucose, Bld: 80 mg/dL (ref 70–99)
Potassium: 3.9 mEq/L (ref 3.5–5.1)
Sodium: 138 mEq/L (ref 135–145)
Total Bilirubin: 0.6 mg/dL (ref 0.2–1.2)
Total Protein: 6.7 g/dL (ref 6.0–8.3)

## 2021-07-06 LAB — URINALYSIS, MICROSCOPIC ONLY
RBC / HPF: NONE SEEN (ref 0–?)
WBC, UA: NONE SEEN (ref 0–?)

## 2021-07-06 LAB — LIPID PANEL
Cholesterol: 179 mg/dL (ref 0–200)
HDL: 62.7 mg/dL (ref >39.00)
LDL Cholesterol: 93 mg/dL (ref 0–99)
NonHDL: 115.82
Total CHOL/HDL Ratio: 3
Triglycerides: 116 mg/dL (ref 0.0–149.0)
VLDL: 23.2 mg/dL (ref 0.0–40.0)

## 2021-07-06 LAB — TSH: TSH: 1.07 u[IU]/mL (ref 0.35–5.50)

## 2021-07-06 LAB — LIPASE: Lipase: 19 U/L (ref 11.0–59.0)

## 2021-07-06 LAB — VITAMIN D 25 HYDROXY (VIT D DEFICIENCY, FRACTURES): VITD: 29.64 ng/mL — ABNORMAL LOW (ref 30.00–100.00)

## 2021-07-06 LAB — AMYLASE: Amylase: 42 U/L (ref 27–131)

## 2021-07-06 NOTE — Progress Notes (Signed)
Scoliosis of thoracic spine notes with Cobb angle 8.7 degrees.

## 2021-07-06 NOTE — Progress Notes (Signed)
Lumbar x ray was negative, however given previous abnormal MRI 2018 will place MRI order of spine for lumbar and thoracic given scoliosis on MRI. She should get a call to schedule MRI within 2 weeks and will need to remove all piercing prior to MRI.  Will wait on results to see if she needs referral to orthopedics versus neurosurgeon.  Follow treatment plan from office  if not improving or any worsening within 72 hours and also return to office or open medical facility at ANYTIME if any symptoms persist, change, or worsen or you have any further concerns or questions. Call 911 immediately for emergencies. Will also add on cervical spine given her paresthesia in hands, cervical spine x ray at our office to schedule.

## 2021-07-06 NOTE — Assessment & Plan Note (Signed)
Needs labs ordered from April 2022 and added labs today. Check b12. Lower extremities likely from lumbar disc, may need neurosurgery will wait on imaging.  Upper extremities, no cervical pain. May need nerve conduction studies cervical x- ray in future discussed with patient.  Follow treatment plan from office  if not improving or any worsening within 72 hours and also return to office or open medical facility at ANYTIME if any symptoms persist, change, or worsen or you have any further concerns or questions. Call 911 immediately/ for emergencies.  Labs today 07/06/2021

## 2021-07-06 NOTE — Patient Instructions (Signed)

## 2021-07-06 NOTE — Progress Notes (Signed)
Vitamin D is mildly low, recommend vitamin D 3 to take 4,000 international units by mouth once daily. Add recheck vitamin D for 3 months recheck.  B12 and folate ok, however B12 low end normal, would advise vitamin b12 supplement to take sublingual under the tongue at 2,000 mcg once daily. Add recheck vitamin B12 with Vitamin d in 3 months.  Amylase, lipase, hemoglobin A1C is within normal limits.  Urinalysis within normal limits.  TSH for thyroid within normal limits.  Lipid panel within normal. CMP and CBC is  within normal limits.    Please schedule follow up labs.

## 2021-07-06 NOTE — Progress Notes (Signed)
Orders Placed This Encounter  Procedures   DG Cervical Spine Complete    Standing Status:   Future    Standing Expiration Date:   07/06/2022    Order Specific Question:   Reason for Exam (SYMPTOM  OR DIAGNOSIS REQUIRED)    Answer:   paresthesisa both arms, scoliosis on thoracic spine film rule out cervical involvement.    Order Specific Question:   Is patient pregnant?    Answer:   No    Order Specific Question:   Preferred imaging location?    Answer:   [facility]   MR Lumbar Spine Wo Contrast    Order Specific Question:   What is the patient's sedation requirement?    Answer:   No Sedation    Order Specific Question:   Does the patient have a pacemaker or implanted devices?    Answer:   Yes    Order Specific Question:   Manufacturer of pacemake or implanted device?    Answer:   none on devices does have multiple piercings asked to remove before MRI    Order Specific Question:   Preferred imaging location?    Answer:   [facility] (table limit - [phone])   MR Thoracic Spine Wo Contrast    Order Specific Question:   What is the patient's sedation requirement?    Answer:   No Sedation    Order Specific Question:   Does the patient have a pacemaker or implanted devices?    Answer:   Yes    Order Specific Question:   Manufacturer of pacemake or implanted device?    Answer:   none on devices does have multiple piercings asked to remove before MRI    Order Specific Question:   Preferred imaging location?    Answer:   [facility] (table limit - 550lbs)

## 2021-07-06 NOTE — Progress Notes (Signed)
Acute Office Visit  Subjective:    Patient ID: Haley Williams, female    DOB: Jan 27, 2001, 21 y.o.   MRN: [id number]  Chief Complaint  Patient presents with   Back Pain    HPI Patient is in today for back pain, she has had some pins and needle feeling in her arms and hands. Also has this sensation in her lower legs.   She has history of seeing an orthopedic years ago.  She has had a history of L4-L5 bulge on MRI 2018, will likely need to have MRI. Was seeing orthopedics she has not seen in years unsure of name.   Denies any neck pain. Denies any injury or trauma.  Dr. Norma Fredricksonoledo [facility] she is not taking linzess, and Pepcid due to insurance needs to schedule recommended colonoscopy / EGD with gastroenterology Dr. Norma Fredricksonoledo.   Patient  denies any fever, body chills, rash, chest pain, shortness of breath, nausea, vomiting, or diarrhea.  Denies dizziness, lightheadedness, pre syncopal or syncopal episodes.    Past Medical History:  Diagnosis Date   Anxiety    Asthma     Past Surgical History:  Procedure Laterality Date   DENTAL SURGERY     TONSILLECTOMY AND ADENOIDECTOMY  2005    Family History  Problem Relation Age of Onset   Healthy Mother    Healthy Father    Hearing loss Maternal Grandmother    Cancer Maternal Grandfather    Hearing loss Maternal Grandfather    Hypertension Maternal Grandfather    Cancer Paternal Grandmother    Hearing loss Paternal Grandfather     Social History   Socioeconomic History   Marital status: Single    Spouse name: Not on file   Number of children: Not on file   Years of education: Not on file   Highest education level: Not on file  Occupational History   Not on file  Tobacco Use   Smoking status: Never   Smokeless tobacco: Never  Vaping Use   Vaping Use: Never used  Substance and Sexual Activity   Alcohol use: Never   Drug use: Never   Sexual activity: Not Currently  Other Topics Concern   Not on file  Social History Narrative    Not on file   Social Determinants of Health   Financial Resource Strain: Not on file  Food Insecurity: Not on file  Transportation Needs: Not on file  Physical Activity: Not on file  Stress: Not on file  Social Connections: Not on file  Intimate Partner Violence: Not on file    Outpatient Medications Prior to Visit  Medication Sig Dispense Refill   benzoyl peroxide 5 % external liquid      clindamycin (CLEOCIN T) 1 % lotion APPLY SPARINGLY TO FACE AND UPPER BACK/SHOULDERS 1-2 TIMES DAILY     JUNEL FE 1.5/30 1.5-30 MG-MCG tablet Take 1 tablet by mouth daily.     tretinoin (RETIN-A) 0.025 % cream APPLY TO FACE AT BEDTIME     dicyclomine (BENTYL) 10 MG capsule Take 1 capsule (10 mg total) by mouth 3 (three) times daily as needed for up to 14 days for spasms. 16 capsule 0   meloxicam (MOBIC) 7.5 MG tablet Take 1 tablet (7.5 mg total) by mouth daily. 30 tablet 1   No facility-administered medications prior to visit.    No Known Allergies  Review of Systems  Constitutional: Negative.   Respiratory: Negative.    Cardiovascular: Negative.   Genitourinary: Negative.   Musculoskeletal:  Positive for arthralgias and back pain. Negative for gait problem, joint swelling, myalgias, neck pain and neck stiffness.  Skin: Negative.   Neurological:  Positive for numbness. Negative for dizziness, tremors, seizures, syncope, facial asymmetry, speech difficulty, weakness, light-headedness and headaches.  Psychiatric/Behavioral: Negative.        Objective:    Physical Exam Vitals reviewed.  Constitutional:      Appearance: Normal appearance. She is well-developed. She is obese.  HENT:     Head: Normocephalic and atraumatic.     Nose: Nose normal.     Mouth/Throat:     Mouth: Mucous membranes are moist.  Eyes:     Conjunctiva/sclera: Conjunctivae normal.  Cardiovascular:     Rate and Rhythm: Normal rate and regular rhythm.     Pulses: Normal pulses.     Heart sounds: Normal heart  sounds.  Pulmonary:     Effort: Pulmonary effort is normal.     Breath sounds: Normal breath sounds. No wheezing, rhonchi or rales.  Musculoskeletal:     Lumbar back: No swelling, edema, spasms, tenderness or bony tenderness. Normal range of motion.     Comments: Full range of motion with flexion, tension, lateral side bends. No bony tenderness. No pain, numbness, tingling elicited with single leg raise bilaterally.   Skin:    General: Skin is warm and dry.  Neurological:     Mental Status: She is alert and oriented to person, place, and time.     Sensory: No sensory deficit.     Deep Tendon Reflexes:     Reflex Scores:      Patellar reflexes are 2+ on the right side and 2+ on the left side.    Comments: Sensation and strength intact bilateral lower extremities.  Psychiatric:        Speech: Speech normal.        Behavior: Behavior normal.        Thought Content: Thought content normal.    BP 112/76 (BP Location: Left Arm, Patient Position: Sitting)    Pulse 84    Ht 5\' 5"  (1.651 m)    Wt 178 lb 9.6 oz (81 kg)    SpO2 97%    BMI 29.72 kg/m  Wt Readings from Last 3 Encounters:  07/06/21 178 lb 9.6 oz (81 kg)  10/12/20 162 lb 6.4 oz (73.7 kg) (89 %, Z= 1.21)*  08/24/20 165 lb (74.8 kg) (90 %, Z= 1.28)*   * Growth percentiles are based on CDC (Girls, 2-20 Years) data.    Health Maintenance Due  Topic Date Due   Pneumococcal Vaccine 10-60 Years old (1 - PPSV23 if available, else PCV20) 03/13/2003   HIV Screening  Never done   Hepatitis C Screening  Never done    There are no preventive care reminders to display for this patient.   No results found for: TSH Lab Results  Component Value Date   WBC 9.3 08/24/2020   HGB 14.4 08/24/2020   HCT 43.5 08/24/2020   MCV 86.7 08/24/2020   PLT 340 08/24/2020   Lab Results  Component Value Date   NA 137 08/24/2020   K 4.0 08/24/2020   CO2 27 08/24/2020   GLUCOSE 103 (H) 08/24/2020   BUN 21 (H) 08/24/2020   CREATININE 0.93  08/24/2020   BILITOT 0.8 08/24/2020   ALKPHOS 50 08/24/2020   AST 24 08/24/2020   ALT 31 08/24/2020   PROT 7.5 08/24/2020   ALBUMIN 4.6 08/24/2020   CALCIUM 9.7 08/24/2020  ANIONGAP 7 08/24/2020   No results found for: CHOL No results found for: HDL No results found for: LDLCALC No results found for: TRIG No results found for: CHOLHDL No results found for: HCWC3J     Assessment & Plan:   Problem List Items Addressed This Visit       Nervous and Auditory   Chronic bilateral low back pain with bilateral sciatica     Musculoskeletal and Integument   L4-L5 disc bulge - Primary     Other   Abdominal bloating   Arthralgia   Vitamin D deficiency   Paresthesia    Needs labs ordered from April 2022 and added labs today. Check b12. Lower extremities likely from lumbar disc, may need neurosurgery will wait on imaging.  Upper extremities, no cervical pain. May need nerve conduction studies cervical x- ray in future discussed with patient.  Follow treatment plan from office  if not improving or any worsening within 72 hours and also return to office or open medical facility at ANYTIME if any symptoms persist, change, or worsen or you have any further concerns or questions. Call 911 immediately/ for emergencies.  Labs today 07/06/2021        Relevant Orders   DG Lumbar Spine Complete   B12 and Folate Panel   Hemoglobin A1c   DG Thoracic Spine 2 View   Labs today and x rays.  Labs from today and labs not drawn yet from 09/2020 patient is again reminded needs labs.  MRI likely needed after x ray given paresthesia.    No orders of the defined types were placed in this encounter.   Red Flags discussed. The patient was given clear instructions to go to ER or return to medical center if any red flags develop, symptoms do not improve, worsen or new problems develop. They verbalized understanding.  Return in about 1 month (around 08/06/2021), or if symptoms worsen or fail to  improve, for at any time for any worsening symptoms, Go to Emergency room/ urgent care if worse.   Jairo Ben, FNP

## 2021-07-07 LAB — RHEUMATOID FACTOR: Rheumatoid fact SerPl-aCnc: <14 IU/mL (ref <14)

## 2021-07-07 NOTE — Progress Notes (Signed)
Rheumatoid factor is negative

## 2021-07-15 ENCOUNTER — Ambulatory Visit: Admission: RE | Admit: 2021-07-15 | Payer: Federal, State, Local not specified - PPO | Source: Ambulatory Visit

## 2021-07-15 ENCOUNTER — Ambulatory Visit
Admission: RE | Admit: 2021-07-15 | Discharge: 2021-07-15 | Disposition: A | Discharge status: Home / Self Care | Payer: Federal, State, Local not specified - PPO | Source: Ambulatory Visit | Attending: Adult Health | Admitting: Adult Health

## 2021-07-15 DIAGNOSIS — M5441 Lumbago with sciatica, right side: Secondary | ICD-10-CM | POA: Diagnosis present

## 2021-07-15 DIAGNOSIS — M5442 Lumbago with sciatica, left side: Secondary | ICD-10-CM | POA: Diagnosis present

## 2021-07-15 DIAGNOSIS — M5136 Other intervertebral disc degeneration, lumbar region: Secondary | ICD-10-CM | POA: Diagnosis present

## 2021-07-15 DIAGNOSIS — G8929 Other chronic pain: Secondary | ICD-10-CM | POA: Diagnosis present

## 2021-07-15 DIAGNOSIS — R768 Other specified abnormal immunological findings in serum: Secondary | ICD-10-CM | POA: Diagnosis present

## 2021-07-15 DIAGNOSIS — M2550 Pain in unspecified joint: Secondary | ICD-10-CM | POA: Diagnosis present

## 2021-07-15 DIAGNOSIS — M4124 Other idiopathic scoliosis, thoracic region: Secondary | ICD-10-CM | POA: Insufficient documentation

## 2021-07-17 NOTE — Progress Notes (Signed)
Mri thoracic spine shows Disc levels: Few trace disc bulges and protrusions are present, at T6-T7. No significant canal or foraminal stenosis at any level. IMPRESSION: Mild degenerative changes as detailed above without stenosis.  Recommend she follow with a neurosurgeon since she will be seeing for lumbar spine. Just let me know  location or provider preference and we can place referral.

## 2021-07-17 NOTE — Progress Notes (Signed)
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 Would she like to see neurosurgeon as recommended for evaluation given Mri result and if so preference for location or doctor? I do recommend she see them for evaluation.

## 2021-07-18 ENCOUNTER — Encounter: Payer: Self-pay | Admitting: Adult Health

## 2021-07-18 ENCOUNTER — Other Ambulatory Visit: Payer: Self-pay | Admitting: Adult Health

## 2021-07-18 DIAGNOSIS — R937 Abnormal findings on diagnostic imaging of other parts of musculoskeletal system: Secondary | ICD-10-CM | POA: Insufficient documentation

## 2021-07-18 DIAGNOSIS — M5136 Other intervertebral disc degeneration, lumbar region: Secondary | ICD-10-CM

## 2021-07-18 DIAGNOSIS — R202 Paresthesia of skin: Secondary | ICD-10-CM

## 2021-07-18 DIAGNOSIS — R9389 Abnormal findings on diagnostic imaging of other specified body structures: Secondary | ICD-10-CM

## 2021-07-18 DIAGNOSIS — G8929 Other chronic pain: Secondary | ICD-10-CM

## 2021-07-18 DIAGNOSIS — M2550 Pain in unspecified joint: Secondary | ICD-10-CM

## 2021-07-18 NOTE — Progress Notes (Signed)
Orders Placed This Encounter  Procedures   Ambulatory referral to Neurosurgery    Referral Priority:   Routine    Referral Type:   Surgical    Referral Reason:   Specialty Services Required    Requested Specialty:   Neurosurgery    Number of Visits Requested:   1    

## 2022-01-09 ENCOUNTER — Encounter: Payer: Self-pay | Admitting: Nurse Practitioner

## 2022-01-09 ENCOUNTER — Ambulatory Visit: Payer: Federal, State, Local not specified - PPO | Admitting: Nurse Practitioner

## 2022-01-09 VITALS — BP 108/80 | HR 84 | Temp 97.6°F | Resp 14 | Ht 65.0 in | Wt 201.2 lb

## 2022-01-09 DIAGNOSIS — E559 Vitamin D deficiency, unspecified: Secondary | ICD-10-CM | POA: Diagnosis not present

## 2022-01-09 DIAGNOSIS — M25552 Pain in left hip: Secondary | ICD-10-CM | POA: Diagnosis not present

## 2022-01-09 DIAGNOSIS — R140 Abdominal distension (gaseous): Secondary | ICD-10-CM

## 2022-01-09 DIAGNOSIS — J4520 Mild intermittent asthma, uncomplicated: Secondary | ICD-10-CM

## 2022-01-09 DIAGNOSIS — M5136 Other intervertebral disc degeneration, lumbar region: Secondary | ICD-10-CM

## 2022-01-09 DIAGNOSIS — F411 Generalized anxiety disorder: Secondary | ICD-10-CM

## 2022-01-09 DIAGNOSIS — Z6833 Body mass index (BMI) 33.0-33.9, adult: Secondary | ICD-10-CM

## 2022-01-09 DIAGNOSIS — Z0000 Encounter for general adult medical examination without abnormal findings: Secondary | ICD-10-CM | POA: Diagnosis not present

## 2022-01-09 DIAGNOSIS — E6609 Other obesity due to excess calories: Secondary | ICD-10-CM | POA: Diagnosis not present

## 2022-01-09 NOTE — Assessment & Plan Note (Signed)
This is historical and recurrent.  Patient has not tried probiotics nor PPI therapy.  Did recommend she try probiotics if not beneficial she can reach out we can always try PPI therapy to see if this helps

## 2022-01-09 NOTE — Assessment & Plan Note (Signed)
Historical diagnosis pending labs today

## 2022-01-09 NOTE — Assessment & Plan Note (Signed)
Noted on MRI.  States she has been evaluated by orthopedist in [place].  She is on a as needed basis.  Continue following up with orthopedist as needed

## 2022-01-09 NOTE — Assessment & Plan Note (Signed)
States she feels like this does affect her sleep.  She also has a very busy schedule did offer medication patient politely declined.  Did offer therapy patient also politely declined.  States she is enrolling at [employer] in the fall and will check with student health to see what services they offer.  No HI/SI/AVH

## 2022-01-09 NOTE — Assessment & Plan Note (Addendum)
States that she has not need an inhaler since highschool.  Continue to monitor reach out if she feels like she needs albuterol.

## 2022-01-09 NOTE — Assessment & Plan Note (Signed)
Discussed age-appropriate immunization and screening exams.

## 2022-01-09 NOTE — Progress Notes (Signed)
Established Patient Office Visit  Subjective   Patient ID: Haley Williams, female    DOB: [birth year]  Age: 21 y.o. MRN: [id number]  Chief Complaint  Patient presents with   Transfer of Care   Annual Exam    HPI TOC/Annual    H: Lives independently with room mate E: Just graduated from [employer]. Transfering to [employer] A: She coaches soccer and play soccer also. D: Denies drug use S: Heterosexual orientation and patient has never been sexually active thus far. S No history of self injury/harm, no hospitalization, No medications in the past. Has done therapy, not currently enrolled in therapy  for complete physical and follow up of chronic conditions.  Immunizations: -Tetanus: Updated 12/28/2021 -Influenza: Out of season -Covid-19: pfizer -Shingles: Too young -Pneumonia: Too young  -HPV: Up-to-date  Diet: Fair diet. Depends some days she does not eat. Generally does not eat breakfast. Will eat in the afternoon and sometimes she will "binge". Water  Exercise:  Play soccer. States that she will go to gym 4-5 times a week and play 4-5 times a week  Eye exam: Completes annually. Glasses  My eye doctor in [place] Dental exam: Completes semi-annually   Pap Smear: Completed with GYN.  They manage patient's OCP Mammogram: Currently too young, higher risk baseline at 35  Colonoscopy: Too young, currently average risk Lung Cancer Screening: N/A Dexa: N/A  Sleep: states she bedtime will vary. Will go to bed aroun 1 am and gets up around. Will get up   Anxiety: states that she will like to start therapy.  States as of late her schedules been busy.  She is enrolling in [employer] in the fall.  Did encourage patient to check with student health as they likely have counseling and psychiatry if needed on campus.  Bulging Disc: was seen by orthopedist in [place].  States that she sees him on a as needed basis.  Did have T and L spine MRI performed through her primary care provider.   Did review those in the chart  Asthma: Child hood.  States that she used it prior to sporting events.  Last use of albuterol inhaler was in high school.  Not currently requiring albuterol inhaler.  Stomach issues: states that she has stomach issues and was seen by GI. States she has had Transvagainal US and CT scan. States that she will get bloated and gasey. This happens intermittently when it comes it can stay for months  Weight: States that she has always been overweight. States that she palyed sports year round. States that she cannot lose the weight but can gain      Review of Systems  Constitutional:  Positive for malaise/fatigue. Negative for chills and fever.  Respiratory:  Negative for shortness of breath.   Cardiovascular:  Negative for chest pain and leg swelling.  Gastrointestinal:  Negative for blood in stool, constipation, diarrhea, nausea and vomiting.       BM daily  Genitourinary:  Negative for dysuria and hematuria.  Neurological:  Positive for tingling. Negative for headaches.  Psychiatric/Behavioral:  Negative for hallucinations and suicidal ideas.       Objective:     BP 108/80   Pulse 84   Temp 97.6 F (36.4 C)   Resp 14   Ht 5\' 5"  (1.651 m)   Wt 201 lb 4 oz (91.3 kg)   LMP 12/28/2019   SpO2 98%   BMI 33.49 kg/m    Physical Exam Vitals and nursing note  reviewed.  Constitutional:      Appearance: Normal appearance.  HENT:     Right Ear: Tympanic membrane, ear canal and external ear normal.     Left Ear: Tympanic membrane, ear canal and external ear normal.     Mouth/Throat:     Mouth: Mucous membranes are moist.     Pharynx: Oropharynx is clear.  Eyes:     Extraocular Movements: Extraocular movements intact.     Pupils: Pupils are equal, round, and reactive to light.  Cardiovascular:     Rate and Rhythm: Normal rate and regular rhythm.     Pulses: Normal pulses.     Heart sounds: Normal heart sounds.  Pulmonary:     Effort: Pulmonary  effort is normal.     Breath sounds: Normal breath sounds.  Abdominal:     General: Bowel sounds are normal. There is no distension.     Palpations: There is no mass.     Tenderness: There is no abdominal tenderness.     Hernia: No hernia is present.  Musculoskeletal:        General: Tenderness present.       Legs:     Comments: Worse with AROM   Some discomfort with PROM  Lymphadenopathy:     Cervical: No cervical adenopathy.  Skin:    General: Skin is warm.  Neurological:     General: No focal deficit present.     Mental Status: She is alert.     Deep Tendon Reflexes:     Reflex Scores:      Bicep reflexes are 2+ on the right side and 2+ on the left side.      Patellar reflexes are 2+ on the right side and 2+ on the left side.    Comments: Bilateral upper and lower extremity strength 5/5  Psychiatric:        Mood and Affect: Mood normal.        Behavior: Behavior normal.        Thought Content: Thought content normal.        Judgment: Judgment normal.      No results found for any visits on 01/09/22.    The ASCVD Risk score (Arnett DK, et al., 2019) failed to calculate for the following reasons:   The 2019 ASCVD risk score is only valid for ages 90 to 18    Assessment & Plan:   Problem List Items Addressed This Visit       Respiratory   Mild intermittent asthma    States that she has not need an inhaler since highschool.  Continue to monitor reach out if she feels like she needs albuterol.        Musculoskeletal and Integument   L4-L5 disc bulge    Noted on MRI.  States she has been evaluated by orthopedist in [place].  She is on a as needed basis.  Continue following up with orthopedist as needed        Other   Anxiety disorder    States she feels like this does affect her sleep.  She also has a very busy schedule did offer medication patient politely declined.  Did offer therapy patient also politely declined.  States she is enrolling at [employer] in the fall and will check with student health to see what services they offer.  No HI/SI/AVH      Abdominal bloating    This is historical and recurrent.  Patient has not  tried probiotics nor PPI therapy.  Did recommend she try probiotics if not beneficial she can reach out we can always try PPI therapy to see if this helps      Relevant Orders   Sedimentation rate   Pain of left hip    Patient was unsure if it was her hip, or intestines.  Has been followed by GI they recommended colonoscopy.  Patient states she did not follow through with colonoscopy at that time.  States she is also had a transvaginal ultrasound to rule out logical etiology.  Did offer to do x-ray in office.  Patient politely declined did inform to have sports medicine in office      Vitamin D deficiency    Historical diagnosis pending labs today      Relevant Orders   VITAMIN D 25 Hydroxy (Vit-D Deficiency, Fractures)   Preventative health care - Primary    Discussed age-appropriate immunization and screening exams.      Relevant Orders   CBC   Lipid panel   Comprehensive metabolic panel   Hemoglobin A1c   TSH   Class 1 obesity due to excess calories without serious comorbidity with body mass index (BMI) of 33.0 to 33.9 in adult   Relevant Orders   Lipid panel   Hemoglobin A1c   TSH    Return in about 1 year (around 01/10/2023) for CPE and labs.    Audria Nine, NP

## 2022-01-09 NOTE — Assessment & Plan Note (Addendum)
Patient was unsure if it was her hip, or intestines.  Has been followed by GI they recommended colonoscopy.  Patient states she did not follow through with colonoscopy at that time.  States she is also had a transvaginal ultrasound to rule out logical etiology.  Did offer to do x-ray in office.  Patient politely declined did inform to have sports medicine in office

## 2022-01-09 NOTE — Patient Instructions (Signed)
Nice to see you today I will be in touch with the labs once I have the results Trying a probiotic likely will be helpful with the bloating Follow up with me in 1 year for your next physical, sooner if you need me

## 2022-01-10 LAB — COMPREHENSIVE METABOLIC PANEL
ALT: 22 U/L (ref 0–35)
AST: 18 U/L (ref 0–37)
Albumin: 4.1 g/dL (ref 3.5–5.2)
Alkaline Phosphatase: 58 U/L (ref 39–117)
BUN: 19 mg/dL (ref 6–23)
CO2: 29 mEq/L (ref 19–32)
Calcium: 9.4 mg/dL (ref 8.4–10.5)
Chloride: 103 mEq/L (ref 96–112)
Creatinine, Ser: 1.12 mg/dL (ref 0.40–1.20)
GFR: 70.62 mL/min (ref >60.00)
Glucose, Bld: 70 mg/dL (ref 70–99)
Potassium: 4.5 mEq/L (ref 3.5–5.1)
Sodium: 140 mEq/L (ref 135–145)
Total Bilirubin: 0.6 mg/dL (ref 0.2–1.2)
Total Protein: 6.8 g/dL (ref 6.0–8.3)

## 2022-01-10 LAB — LIPID PANEL
Cholesterol: 219 mg/dL — ABNORMAL HIGH (ref 0–200)
HDL: 73.2 mg/dL (ref >39.00)
LDL Cholesterol: 131 mg/dL — ABNORMAL HIGH (ref 0–99)
NonHDL: 145.91
Total CHOL/HDL Ratio: 3
Triglycerides: 76 mg/dL (ref 0.0–149.0)
VLDL: 15.2 mg/dL (ref 0.0–40.0)

## 2022-01-10 LAB — HEMOGLOBIN A1C: Hgb A1c MFr Bld: 5.2 % (ref 4.6–6.5)

## 2022-01-10 LAB — CBC
HCT: 39.7 % (ref 36.0–46.0)
Hemoglobin: 13.3 g/dL (ref 12.0–15.0)
MCHC: 33.4 g/dL (ref 30.0–36.0)
MCV: 88.9 fl (ref 78.0–100.0)
Platelets: 346 10*3/uL (ref 150.0–400.0)
RBC: 4.46 Mil/uL (ref 3.87–5.11)
RDW: 12.9 % (ref 11.5–14.6)
WBC: 8.7 10*3/uL (ref 4.5–10.5)

## 2022-01-10 LAB — SEDIMENTATION RATE: Sed Rate: 5 mm/hr (ref 0–20)

## 2022-01-10 LAB — VITAMIN D 25 HYDROXY (VIT D DEFICIENCY, FRACTURES): VITD: 25.23 ng/mL — ABNORMAL LOW (ref 30.00–100.00)

## 2022-01-10 LAB — TSH: TSH: 0.36 u[IU]/mL (ref 0.35–5.50)

## 2022-04-25 IMAGING — MR MR THORACIC SPINE W/O CM
10 of 12 series · 37 of 48 positions shown · non-contrast
Comparison: None.

CLINICAL DATA: Mid-back pain, spondyloarthropathy suspected

EXAM:
MRI THORACIC SPINE WITHOUT CONTRAST
TECHNIQUE: Multiplanar, multisequence MR imaging of the thoracic spine was
performed. No intravenous contrast was administered.

[Series 18: T1 · sagittal · 6.0mm · 1.88mm/px · 1 of 9 slices shown (1 of 4)]
[im 1/9]
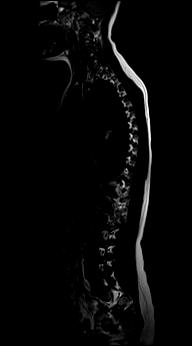

[Series 19: T2 · sagittal · 3.0mm · 1.06mm/px · 2 of 17 slices shown (1 of 5)]
[im 1/17]
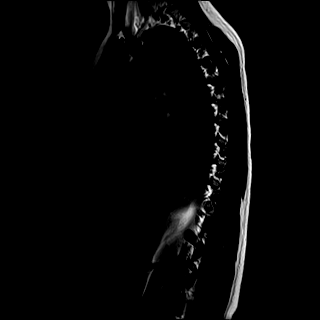
[im 17/17]
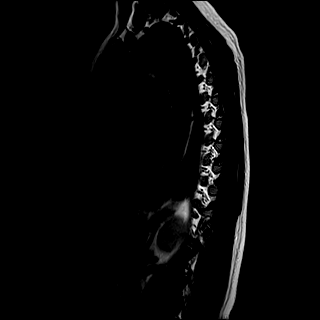

[Series 20: T1 · sagittal · 3.0mm · 1.06mm/px · 3 of 17 slices shown (2 of 4)]
[im 1/17]
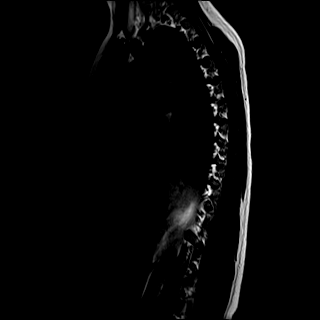
[im 9/17]
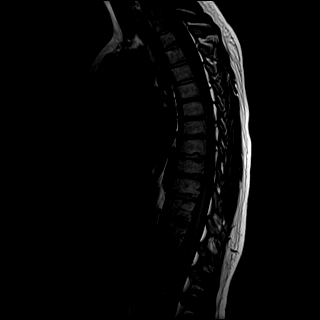
[im 17/17]
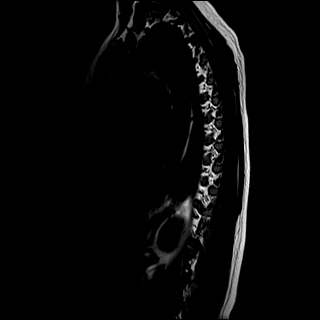

[Series 21: STIR · sagittal · 3.0mm · 0.53mm/px · 1 of 17 slices shown]
[im 1/17]
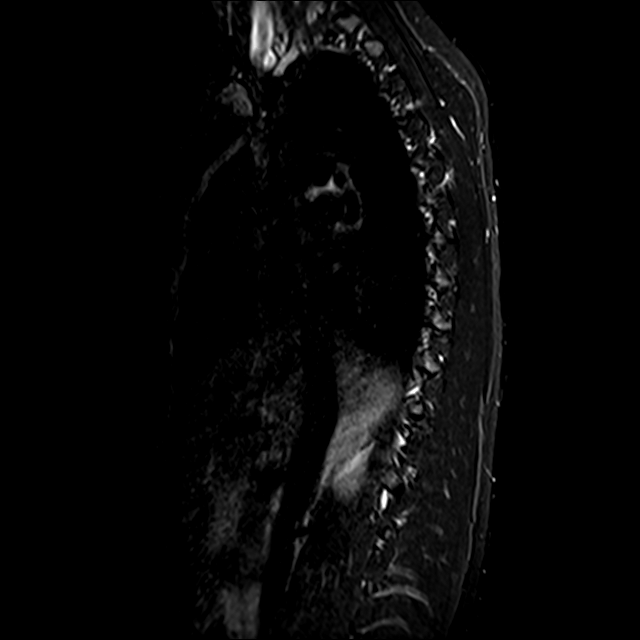

[Series 22: T2 · axial · 4.0mm · 0.59mm/px · z∈[-336,-108]mm · 6 of 39 slices shown (2 of 5)]
[im 1/39]
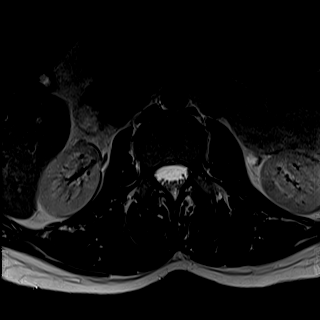
[im 8/39]
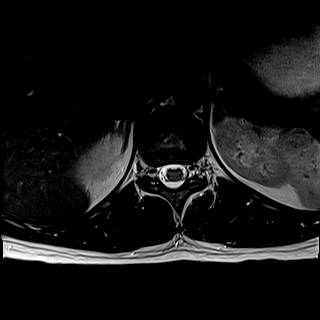
[im 16/39]
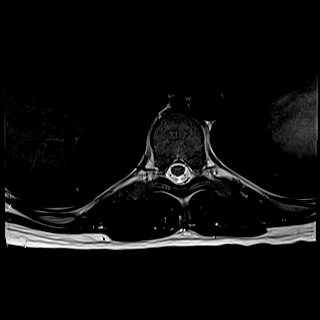
[im 23/39]
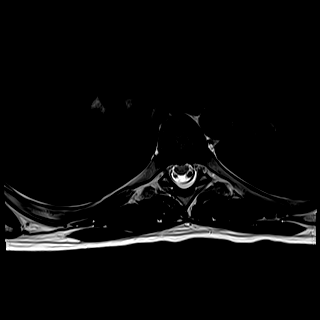
[im 31/39]
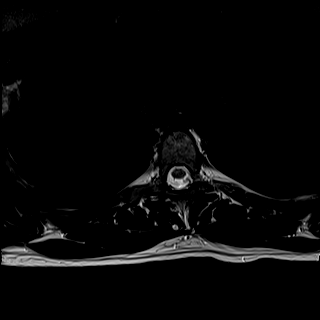
[im 39/39]
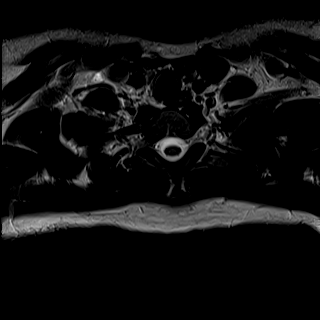

[Series 24: T2 · sagittal · 4.0mm · 0.81mm/px · 3 of 17 slices shown (3 of 5)]
[im 1/17]
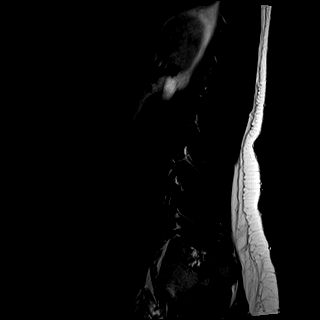
[im 9/17]
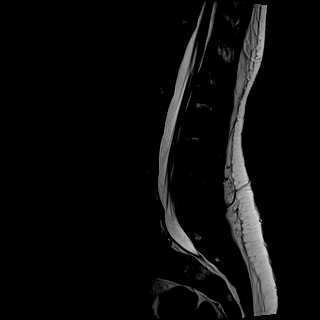
[im 17/17]
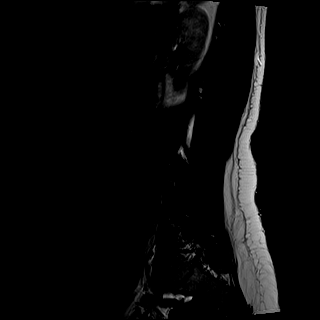

[Series 25: T1 · sagittal · 4.0mm · 0.81mm/px · 3 of 17 slices shown (3 of 4)]
[im 1/17]
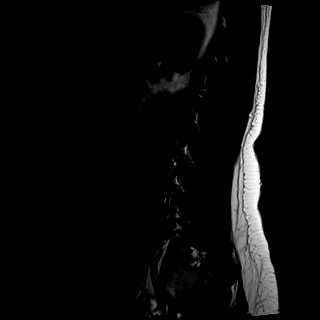
[im 9/17]
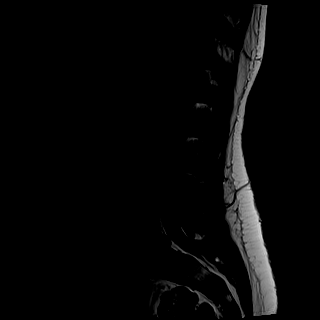
[im 17/17]
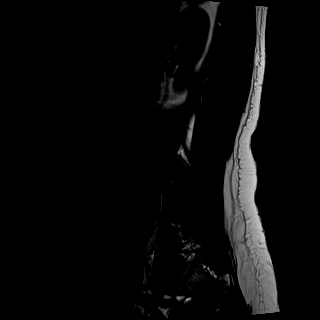

[Series 27: T2 · axial · 4.0mm · 0.78mm/px · z∈[-529,-309]mm · 6 of 36 slices shown (4 of 5)]
[im 1/36]
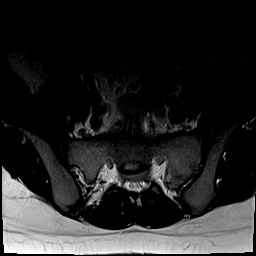
[im 8/36]
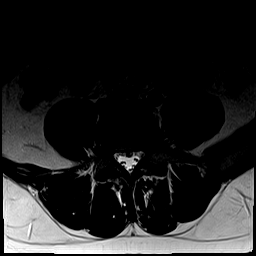
[im 15/36]
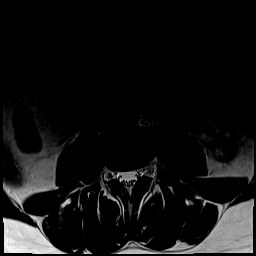
[im 22/36]
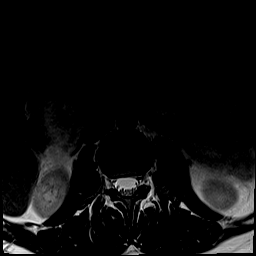
[im 29/36]
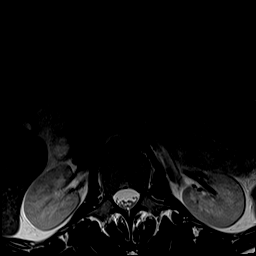
[im 36/36]
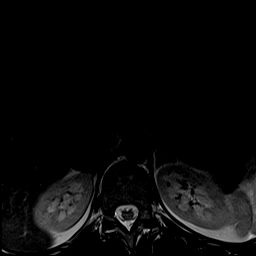

[Series 28: T1 · axial · 4.0mm · 0.39mm/px · z∈[-519,-310]mm · 6 of 36 slices shown (4 of 4)]
[im 1/36]
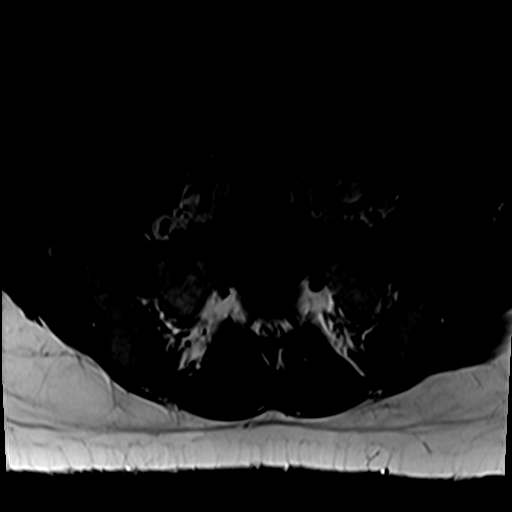
[im 8/36]
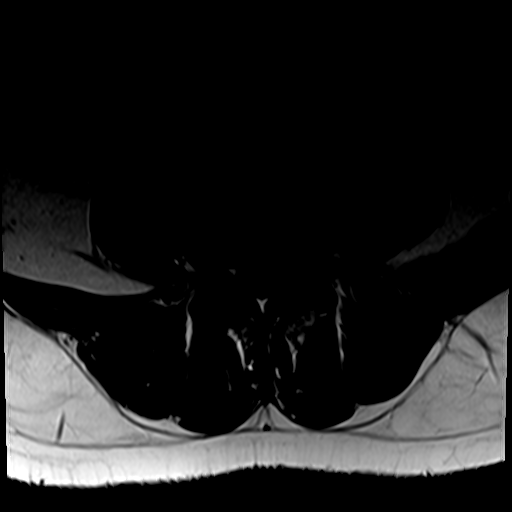
[im 15/36]
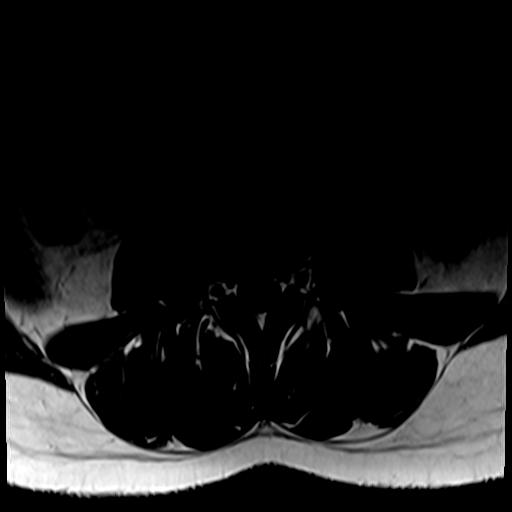
[im 22/36]
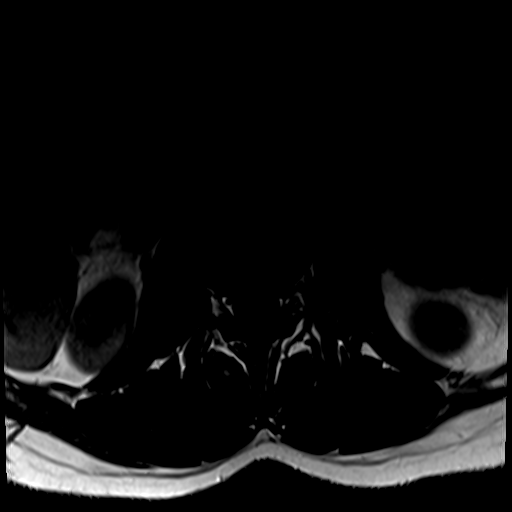
[im 29/36]
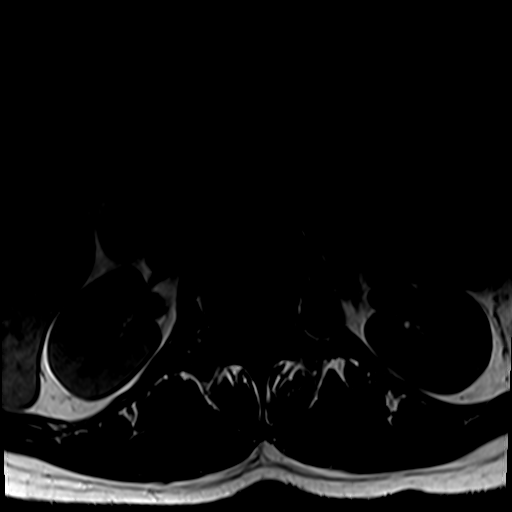
[im 36/36]
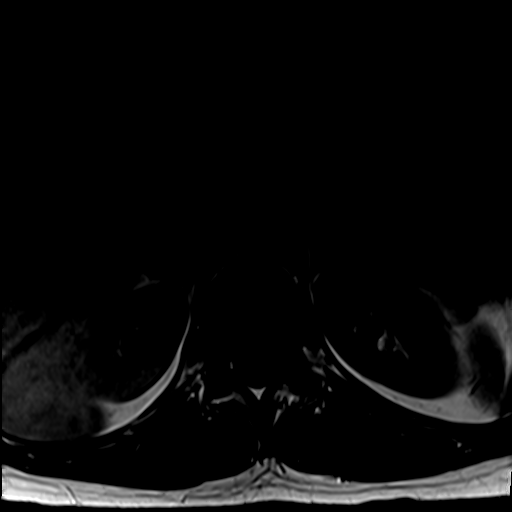

[Series 29: T2 · axial · 4.0mm · 0.78mm/px · z∈[-519,-310]mm · 6 of 36 slices shown (5 of 5)]
[im 1/36]
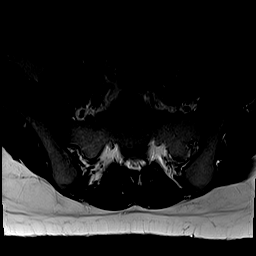
[im 8/36]
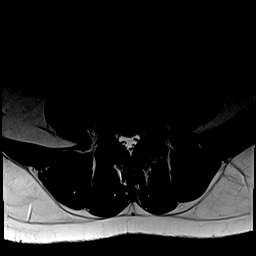
[im 15/36]
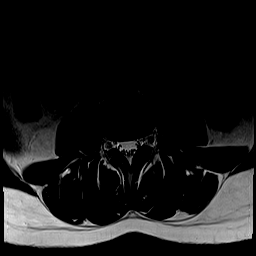
[im 22/36]
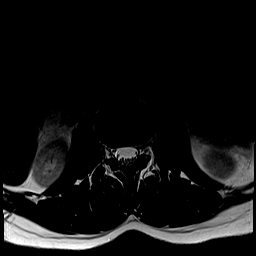
[im 29/36]
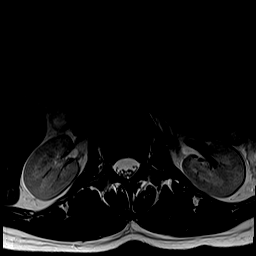
[im 36/36]
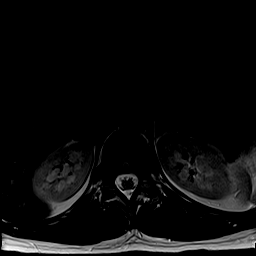

[37 of 48 positions shown; findings below may reference images not displayed]

FINDINGS: Alignment: Levocurvature. No significant anteroposterior listhesis.

Vertebrae: Endplate Schmorl's nodes are present at mid and lower
thoracic levels. There is no substantial marrow edema. No suspicious
osseous lesion.

Cord:  Normal signal.

Paraspinal and other soft tissues: Unremarkable.

Disc levels: Few trace disc bulges and protrusions are present, for
example at T6-T7. No significant canal or foraminal stenosis at any
level.
IMPRESSION: Mild degenerative changes as detailed above without stenosis.
